# Patient Record
Sex: Female | Born: 1960 | Race: Black or African American | Hispanic: No | Smoking: Current every day smoker
Health system: Southern US, Community
[De-identification: ages and names within clinical notes are randomized; demographics above are authoritative.]

## PROBLEM LIST (undated history)

## (undated) ENCOUNTER — Emergency Department: Payer: 59 | Source: Home / Self Care

## (undated) DIAGNOSIS — J45909 Unspecified asthma, uncomplicated: Secondary | ICD-10-CM

## (undated) DIAGNOSIS — F419 Anxiety disorder, unspecified: Secondary | ICD-10-CM

## (undated) DIAGNOSIS — E119 Type 2 diabetes mellitus without complications: Secondary | ICD-10-CM

## (undated) DIAGNOSIS — J449 Chronic obstructive pulmonary disease, unspecified: Secondary | ICD-10-CM

## (undated) DIAGNOSIS — I639 Cerebral infarction, unspecified: Secondary | ICD-10-CM

## (undated) HISTORY — PX: KNEE SURGERY: SHX244

## (undated) HISTORY — PX: CARPAL TUNNEL RELEASE: SHX101

---

## 2007-01-22 ENCOUNTER — Emergency Department: Payer: Self-pay | Admitting: Emergency Medicine

## 2007-10-24 DIAGNOSIS — I6350 Cerebral infarction due to unspecified occlusion or stenosis of unspecified cerebral artery: Secondary | ICD-10-CM | POA: Insufficient documentation

## 2008-01-09 DIAGNOSIS — E7800 Pure hypercholesterolemia, unspecified: Secondary | ICD-10-CM | POA: Insufficient documentation

## 2008-10-30 ENCOUNTER — Emergency Department: Payer: Self-pay | Admitting: Emergency Medicine

## 2009-02-18 DIAGNOSIS — F17200 Nicotine dependence, unspecified, uncomplicated: Secondary | ICD-10-CM | POA: Insufficient documentation

## 2009-08-15 ENCOUNTER — Emergency Department: Payer: Self-pay | Admitting: Emergency Medicine

## 2009-08-25 ENCOUNTER — Emergency Department: Payer: Self-pay | Admitting: Emergency Medicine

## 2009-08-25 ENCOUNTER — Emergency Department: Payer: Self-pay | Admitting: Internal Medicine

## 2009-11-05 ENCOUNTER — Emergency Department: Payer: Self-pay | Admitting: Emergency Medicine

## 2009-12-02 DIAGNOSIS — F200 Paranoid schizophrenia: Secondary | ICD-10-CM | POA: Insufficient documentation

## 2010-11-14 ENCOUNTER — Emergency Department (HOSPITAL_COMMUNITY): Payer: Medicare Other

## 2010-11-14 ENCOUNTER — Emergency Department (HOSPITAL_COMMUNITY)
Admission: EM | Admit: 2010-11-14 | Discharge: 2010-11-14 | Disposition: A | Discharge status: Home / Self Care | Payer: Medicare Other | Attending: Emergency Medicine | Admitting: Emergency Medicine

## 2010-11-14 DIAGNOSIS — I10 Essential (primary) hypertension: Secondary | ICD-10-CM | POA: Insufficient documentation

## 2010-11-14 DIAGNOSIS — J45909 Unspecified asthma, uncomplicated: Secondary | ICD-10-CM | POA: Insufficient documentation

## 2010-11-14 DIAGNOSIS — M79609 Pain in unspecified limb: Secondary | ICD-10-CM | POA: Insufficient documentation

## 2010-11-14 DIAGNOSIS — R209 Unspecified disturbances of skin sensation: Secondary | ICD-10-CM | POA: Insufficient documentation

## 2010-11-14 DIAGNOSIS — Z8673 Personal history of transient ischemic attack (TIA), and cerebral infarction without residual deficits: Secondary | ICD-10-CM | POA: Insufficient documentation

## 2010-11-14 DIAGNOSIS — E789 Disorder of lipoprotein metabolism, unspecified: Secondary | ICD-10-CM | POA: Insufficient documentation

## 2010-11-14 DIAGNOSIS — R29898 Other symptoms and signs involving the musculoskeletal system: Secondary | ICD-10-CM | POA: Insufficient documentation

## 2010-11-14 LAB — POCT I-STAT, CHEM 8
Calcium, Ion: 1.12 mmol/L (ref 1.12–1.32)
Chloride: 110 mEq/L (ref 96–112)
Glucose, Bld: 97 mg/dL (ref 70–99)
HCT: 40 % (ref 36.0–46.0)
TCO2: 23 mmol/L (ref 0–100)

## 2011-07-14 DIAGNOSIS — E669 Obesity, unspecified: Secondary | ICD-10-CM | POA: Insufficient documentation

## 2012-04-01 ENCOUNTER — Emergency Department: Payer: Self-pay | Admitting: Emergency Medicine

## 2013-02-02 ENCOUNTER — Emergency Department (HOSPITAL_COMMUNITY)
Admission: EM | Admit: 2013-02-02 | Discharge: 2013-02-02 | Disposition: A | Discharge status: Home / Self Care | Payer: Medicare Other | Attending: Emergency Medicine | Admitting: Emergency Medicine

## 2013-02-02 ENCOUNTER — Encounter (HOSPITAL_COMMUNITY): Payer: Self-pay | Admitting: Family Medicine

## 2013-02-02 DIAGNOSIS — M549 Dorsalgia, unspecified: Secondary | ICD-10-CM | POA: Insufficient documentation

## 2013-02-02 DIAGNOSIS — Z7982 Long term (current) use of aspirin: Secondary | ICD-10-CM | POA: Insufficient documentation

## 2013-02-02 DIAGNOSIS — M79604 Pain in right leg: Secondary | ICD-10-CM

## 2013-02-02 DIAGNOSIS — F17200 Nicotine dependence, unspecified, uncomplicated: Secondary | ICD-10-CM | POA: Insufficient documentation

## 2013-02-02 DIAGNOSIS — R209 Unspecified disturbances of skin sensation: Secondary | ICD-10-CM | POA: Insufficient documentation

## 2013-02-02 DIAGNOSIS — Z8673 Personal history of transient ischemic attack (TIA), and cerebral infarction without residual deficits: Secondary | ICD-10-CM | POA: Insufficient documentation

## 2013-02-02 DIAGNOSIS — Z79899 Other long term (current) drug therapy: Secondary | ICD-10-CM | POA: Insufficient documentation

## 2013-02-02 DIAGNOSIS — F411 Generalized anxiety disorder: Secondary | ICD-10-CM | POA: Insufficient documentation

## 2013-02-02 DIAGNOSIS — IMO0001 Reserved for inherently not codable concepts without codable children: Secondary | ICD-10-CM | POA: Insufficient documentation

## 2013-02-02 DIAGNOSIS — M79609 Pain in unspecified limb: Secondary | ICD-10-CM

## 2013-02-02 HISTORY — DX: Cerebral infarction, unspecified: I63.9

## 2013-02-02 HISTORY — DX: Anxiety disorder, unspecified: F41.9

## 2013-02-02 MED ORDER — METHOCARBAMOL 500 MG PO TABS
500.0000 mg | ORAL_TABLET | Freq: Once | ORAL | Status: AC
  Administered 2013-02-02: 500 mg via ORAL
  Filled 2013-02-02: qty 1

## 2013-02-02 MED ORDER — HYDROCODONE-ACETAMINOPHEN 5-325 MG PO TABS: 1.0000 | ORAL_TABLET | ORAL | Status: DC | PRN

## 2013-02-02 MED ORDER — METHOCARBAMOL 500 MG PO TABS: 500.0000 mg | ORAL_TABLET | Freq: Two times a day (BID) | ORAL | Status: DC

## 2013-02-02 MED ORDER — HYDROCODONE-ACETAMINOPHEN 5-325 MG PO TABS
1.0000 | ORAL_TABLET | Freq: Once | ORAL | Status: AC
  Administered 2013-02-02: 1 via ORAL
  Filled 2013-02-02: qty 1

## 2013-02-02 NOTE — ED Notes (Signed)
Per pt sts cramps in right leg for a few days. sts from her calf up to her hip.

## 2013-02-02 NOTE — ED Notes (Signed)
Pt reports bending her right leg in an unusual position on Tuesday and immediately feeling cramps and shocks running from knee to toes and up to hip.  No obvious deformity.  Pt alert oriented X4

## 2013-02-02 NOTE — Progress Notes (Signed)
*  Preliminary Results* Right lower extremity venous duplex completed. Right lower extremity is negative for deep vein thrombosis. There is no evidence of right Baker's cyst. Preliminary results discussed with Dorene Grebe, RN.  02/02/2013 4:31 PM Gertie Fey, RVT, RDCS, RDMS

## 2013-02-02 NOTE — ED Provider Notes (Signed)
History    This chart was scribed for non-physician practitioner, Fayrene Helper PA-C, working with Gwyneth Sprout, MD by Donne Anon, ED Scribe. This patient was seen in room TR10C/TR10C and the patient's care was started at 1507.   CSN: [id number] Arrival date & time 02/02/13  1349  First MD Initiated Contact with Patient 02/02/13 1507     Chief Complaint  Patient presents with  . Leg Pain    The history is provided by the patient. No language interpreter was used.   HPI Comments: Kathleen Gilmore is a 52 y.o. female who presents to the Emergency Department complaining of 3 days of sudden onset, gradually worsening, constant throbbing right calf pain that radiates down to her toes. The pain began as she was laying down in bed, and no movement was associated with its onset. She reports associated tingling in her toes and lower back pain. The pain is worse with movement, and nothing makes it better. She has tried icy hot with little relief. She denies any previous similar episodes. She reports a hx of CVA affecting her right side with no residual deficit, but states she is otherwise healthy and denies recent surgery, hormone supplements, recent long trips, or hx of cancer. Denies urinary/bowel incontinence, or saddle paresthesia    Dr. Gwenlyn Fudge is her PCP. She has not seen him about this issue. She currently smokes 1 pack of cigarettes per day.  Past Medical History  Diagnosis Date  . Stroke   . Anxiety    History reviewed. No pertinent past surgical history. History reviewed. No pertinent family history. History  Substance Use Topics  . Smoking status: Current Every Day Smoker  . Smokeless tobacco: Not on file  . Alcohol Use: No   OB History   Grav Para Term Preterm Abortions TAB SAB Ect Mult Living                 Review of Systems  Constitutional: Negative for fever.  Genitourinary: Negative for dysuria.  Musculoskeletal: Positive for myalgias and back pain.   Neurological: Positive for numbness.  All other systems reviewed and are negative.    Allergies  Review of patient's allergies indicates no known allergies.  Home Medications   Current Outpatient Rx  Name  Route  Sig  Dispense  Refill  . albuterol (PROVENTIL HFA;VENTOLIN HFA) 108 (90 BASE) MCG/ACT inhaler   Inhalation   Inhale 2 puffs into the lungs 2 (two) times daily as needed for wheezing.         Marland Kitchen aspirin EC 81 MG tablet   Oral   Take 81 mg by mouth daily.         . budesonide-formoterol (SYMBICORT) 160-4.5 MCG/ACT inhaler   Inhalation   Inhale 2 puffs into the lungs 2 (two) times daily.         . clonazePAM (KLONOPIN) 0.5 MG tablet   Oral   Take 0.5 mg by mouth 2 (two) times daily.         . cyclobenzaprine (FLEXERIL) 5 MG tablet   Oral   Take 5 mg by mouth 2 (two) times daily as needed for muscle spasms.         . fluticasone (FLONASE) 50 MCG/ACT nasal spray   Nasal   Place 2 sprays into the nose as needed for rhinitis or allergies.         . simvastatin (ZOCOR) 40 MG tablet   Oral   Take 20 mg by mouth daily.         Marland Kitchen  traZODone (DESYREL) 100 MG tablet   Oral   Take 100 mg by mouth 2 (two) times daily.          BP 125/59  Pulse 90  Temp(Src) 98.3 F (36.8 C)  Resp 18  SpO2 97%  Physical Exam  Nursing note and vitals reviewed. Constitutional: She appears well-developed and well-nourished. No distress.  HENT:  Head: Normocephalic and atraumatic.  Eyes: Conjunctivae are normal.  Neck: Neck supple. No tracheal deviation present.  Cardiovascular: Normal rate and intact distal pulses.   Pulmonary/Chest: Effort normal. No respiratory distress.  Musculoskeletal: Normal range of motion. She exhibits tenderness.  Right leg with diffuse tenderness to right calf. No palpable cords, erythema, edema, bruising or deformity noted. Subjective peristhesia to sole of right foot and aslo to lateral aspect of right foot. Normal ROM through hip, knee  and ankle. Intact dorsalis pedis.   No significant midline spine tenderness, crepitus, or step off  Neurological: She is alert. She has normal reflexes.  Sensation intact to lower extremities bilaterally, no foot drop, however pt report mild paresthesia along sole of R foot and lateral aspect of R foot to ankle.    Skin: Skin is warm and dry. No rash noted.  Psychiatric: She has a normal mood and affect. Her behavior is normal.    ED Course  Procedures (including critical care time)  Author: Gwendolyn Fill Service: Vascular Lab Author Type: Cardiovascular Sonographer   Filed: 02/02/2013 4:33 PM Note Time: 02/02/2013 4:31 PM         *Preliminary Results*  Right lower extremity venous duplex completed.  Right lower extremity is negative for deep vein thrombosis. There is no evidence of right Baker's cyst.  Preliminary results discussed with Dorene Grebe, RN.  02/02/2013 4:31 PM  Gertie Fey, RVT, RDCS, RDMS      DIAGNOSTIC STUDIES: Oxygen Saturation is 97% on RA, adequate by my interpretation.    COORDINATION OF CARE: 3:21 PM Discussed treatment plan which includes doppler test, Norco and Robaxin with pt at bedside and pt agreed to plan.   Doubt spinal cord compression, spinal abscess, no significant red flags.  Since pt has prior stroke and having calf pain, doppler study ordered.  Doubt claudication as DP and PT pulses palpable.   5:37 PM Doppler study neg for DVT.  Since pt has no urinary/bowel incontinence, saddle paresthesia for signs of infection, will treat for MSK with RICE, pt to f/u with ortho.  Return precaution discussed.   Labs Reviewed - No data to display No results found. 1. Leg pain, right     MDM  BP 125/59  Pulse 90  Temp(Src) 98.3 F (36.8 C)  Resp 18  SpO2 97%  I have reviewed nursing notes and vital signs. I personally reviewed the imaging tests through PACS system  I reviewed available ER/hospitalization records thought the EMR   I  personally performed the services described in this documentation, which was scribed in my presence. The recorded information has been reviewed and is accurate.     Fayrene Helper, PA-C 02/02/13 1749  Fayrene Helper, PA-C 02/02/13 1752

## 2013-02-03 NOTE — ED Provider Notes (Signed)
Medical screening examination/treatment/procedure(s) were performed by non-physician practitioner and as supervising physician I was immediately available for consultation/collaboration.   Marguarite Markov, MD 02/03/13 2315 

## 2015-08-20 DIAGNOSIS — Z9889 Other specified postprocedural states: Secondary | ICD-10-CM | POA: Insufficient documentation

## 2015-11-15 ENCOUNTER — Emergency Department
Admission: EM | Admit: 2015-11-15 | Discharge: 2015-11-15 | Disposition: A | Discharge status: Home / Self Care | Payer: No Typology Code available for payment source | Attending: Emergency Medicine | Admitting: Emergency Medicine

## 2015-11-15 ENCOUNTER — Emergency Department: Payer: No Typology Code available for payment source

## 2015-11-15 DIAGNOSIS — S39012A Strain of muscle, fascia and tendon of lower back, initial encounter: Secondary | ICD-10-CM | POA: Diagnosis not present

## 2015-11-15 DIAGNOSIS — Y999 Unspecified external cause status: Secondary | ICD-10-CM | POA: Insufficient documentation

## 2015-11-15 DIAGNOSIS — Z7951 Long term (current) use of inhaled steroids: Secondary | ICD-10-CM | POA: Diagnosis not present

## 2015-11-15 DIAGNOSIS — F17200 Nicotine dependence, unspecified, uncomplicated: Secondary | ICD-10-CM | POA: Insufficient documentation

## 2015-11-15 DIAGNOSIS — Z7982 Long term (current) use of aspirin: Secondary | ICD-10-CM | POA: Diagnosis not present

## 2015-11-15 DIAGNOSIS — Y92410 Unspecified street and highway as the place of occurrence of the external cause: Secondary | ICD-10-CM | POA: Insufficient documentation

## 2015-11-15 DIAGNOSIS — Y939 Activity, unspecified: Secondary | ICD-10-CM | POA: Insufficient documentation

## 2015-11-15 DIAGNOSIS — M549 Dorsalgia, unspecified: Secondary | ICD-10-CM | POA: Diagnosis present

## 2015-11-15 DIAGNOSIS — Z79899 Other long term (current) drug therapy: Secondary | ICD-10-CM | POA: Diagnosis not present

## 2015-11-15 DIAGNOSIS — S161XXA Strain of muscle, fascia and tendon at neck level, initial encounter: Secondary | ICD-10-CM | POA: Diagnosis not present

## 2015-11-15 DIAGNOSIS — Z8673 Personal history of transient ischemic attack (TIA), and cerebral infarction without residual deficits: Secondary | ICD-10-CM | POA: Insufficient documentation

## 2015-11-15 MED ORDER — IBUPROFEN 800 MG PO TABS: 800.0000 mg | ORAL_TABLET | Freq: Three times a day (TID) | ORAL | Status: DC | PRN

## 2015-11-15 MED ORDER — METHOCARBAMOL 750 MG PO TABS: 750.0000 mg | ORAL_TABLET | Freq: Four times a day (QID) | ORAL | Status: DC

## 2015-11-15 MED ORDER — OXYCODONE-ACETAMINOPHEN 5-325 MG PO TABS: 1.0000 | ORAL_TABLET | Freq: Four times a day (QID) | ORAL | Status: DC | PRN

## 2015-11-15 MED ORDER — METHOCARBAMOL 500 MG PO TABS
1000.0000 mg | ORAL_TABLET | Freq: Once | ORAL | Status: AC
  Administered 2015-11-15: 1000 mg via ORAL
  Filled 2015-11-15: qty 2

## 2015-11-15 MED ORDER — OXYCODONE-ACETAMINOPHEN 5-325 MG PO TABS
1.0000 | ORAL_TABLET | Freq: Once | ORAL | Status: AC
  Administered 2015-11-15: 1 via ORAL
  Filled 2015-11-15: qty 1
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
## 2015-11-15 NOTE — Discharge Instructions (Signed)

## 2015-12-17 DIAGNOSIS — S134XXA Sprain of ligaments of cervical spine, initial encounter: Secondary | ICD-10-CM | POA: Insufficient documentation

## 2016-05-06 DIAGNOSIS — M545 Low back pain, unspecified: Secondary | ICD-10-CM | POA: Insufficient documentation

## 2016-10-24 ENCOUNTER — Emergency Department: Payer: No Typology Code available for payment source

## 2016-10-24 ENCOUNTER — Encounter: Payer: Self-pay | Admitting: Emergency Medicine

## 2016-10-24 ENCOUNTER — Emergency Department
Admission: EM | Admit: 2016-10-24 | Discharge: 2016-10-24 | Disposition: A | Discharge status: Home / Self Care | Payer: No Typology Code available for payment source | Attending: Emergency Medicine | Admitting: Emergency Medicine

## 2016-10-24 DIAGNOSIS — F17200 Nicotine dependence, unspecified, uncomplicated: Secondary | ICD-10-CM | POA: Insufficient documentation

## 2016-10-24 DIAGNOSIS — S161XXA Strain of muscle, fascia and tendon at neck level, initial encounter: Secondary | ICD-10-CM | POA: Insufficient documentation

## 2016-10-24 DIAGNOSIS — S39012A Strain of muscle, fascia and tendon of lower back, initial encounter: Secondary | ICD-10-CM | POA: Insufficient documentation

## 2016-10-24 DIAGNOSIS — Y999 Unspecified external cause status: Secondary | ICD-10-CM | POA: Diagnosis not present

## 2016-10-24 DIAGNOSIS — S29019A Strain of muscle and tendon of unspecified wall of thorax, initial encounter: Secondary | ICD-10-CM | POA: Diagnosis not present

## 2016-10-24 DIAGNOSIS — Y9389 Activity, other specified: Secondary | ICD-10-CM | POA: Insufficient documentation

## 2016-10-24 DIAGNOSIS — Y92410 Unspecified street and highway as the place of occurrence of the external cause: Secondary | ICD-10-CM | POA: Insufficient documentation

## 2016-10-24 DIAGNOSIS — Z7982 Long term (current) use of aspirin: Secondary | ICD-10-CM | POA: Insufficient documentation

## 2016-10-24 DIAGNOSIS — E119 Type 2 diabetes mellitus without complications: Secondary | ICD-10-CM | POA: Diagnosis not present

## 2016-10-24 DIAGNOSIS — S0990XA Unspecified injury of head, initial encounter: Secondary | ICD-10-CM | POA: Diagnosis present

## 2016-10-24 DIAGNOSIS — Z7984 Long term (current) use of oral hypoglycemic drugs: Secondary | ICD-10-CM | POA: Diagnosis not present

## 2016-10-24 DIAGNOSIS — M5412 Radiculopathy, cervical region: Secondary | ICD-10-CM

## 2016-10-24 HISTORY — DX: Type 2 diabetes mellitus without complications: E11.9

## 2016-10-24 MED ORDER — PREDNISONE 10 MG PO TABS: 10.0000 mg | ORAL_TABLET | Freq: Every day | ORAL | 0 refills | Status: DC

## 2016-10-24 MED ORDER — OXYCODONE HCL 5 MG PO TABS
5.0000 mg | ORAL_TABLET | Freq: Three times a day (TID) | ORAL | 0 refills | Status: AC | PRN
Start: 2016-10-24 — End: 2017-10-24

## 2016-10-24 MED ORDER — HYDROCODONE-ACETAMINOPHEN 5-325 MG PO TABS
1.0000 | ORAL_TABLET | ORAL | Status: AC
  Administered 2016-10-24: 1 via ORAL
  Filled 2016-10-24: qty 1

## 2016-10-24 MED ORDER — ORPHENADRINE CITRATE 30 MG/ML IJ SOLN
60.0000 mg | Freq: Two times a day (BID) | INTRAMUSCULAR | Status: DC
  Administered 2016-10-24: 60 mg via INTRAMUSCULAR
  Filled 2016-10-24: qty 2

## 2016-10-24 MED ORDER — CYCLOBENZAPRINE HCL 5 MG PO TABS: 5.0000 mg | ORAL_TABLET | Freq: Three times a day (TID) | ORAL | 0 refills | Status: DC | PRN

## 2016-10-24 MED ORDER — KETOROLAC TROMETHAMINE 30 MG/ML IJ SOLN
30.0000 mg | Freq: Once | INTRAMUSCULAR | Status: AC
  Administered 2016-10-24: 30 mg via INTRAMUSCULAR
  Filled 2016-10-24: qty 1

## 2016-10-24 NOTE — Discharge Instructions (Signed)
Please take medications as prescribed and return to the emergency department for any worsening symptoms urgent changes in her health. Follow-up with orthopedics if no improvement in 5-7 days.
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
## 2017-08-13 DIAGNOSIS — M179 Osteoarthritis of knee, unspecified: Secondary | ICD-10-CM | POA: Insufficient documentation

## 2017-09-04 ENCOUNTER — Emergency Department
Admission: EM | Admit: 2017-09-04 | Discharge: 2017-09-04 | Disposition: A | Discharge status: Home / Self Care | Payer: Medicare Other | Attending: Student in an Organized Health Care Education/Training Program | Admitting: Student in an Organized Health Care Education/Training Program

## 2017-09-04 ENCOUNTER — Other Ambulatory Visit: Payer: Self-pay

## 2017-09-04 ENCOUNTER — Encounter: Payer: Self-pay | Admitting: Emergency Medicine

## 2017-09-04 ENCOUNTER — Emergency Department: Payer: Medicare Other

## 2017-09-04 DIAGNOSIS — R079 Chest pain, unspecified: Secondary | ICD-10-CM | POA: Diagnosis present

## 2017-09-04 DIAGNOSIS — E119 Type 2 diabetes mellitus without complications: Secondary | ICD-10-CM | POA: Diagnosis not present

## 2017-09-04 DIAGNOSIS — Z7982 Long term (current) use of aspirin: Secondary | ICD-10-CM | POA: Insufficient documentation

## 2017-09-04 DIAGNOSIS — R0789 Other chest pain: Secondary | ICD-10-CM | POA: Insufficient documentation

## 2017-09-04 DIAGNOSIS — Z7984 Long term (current) use of oral hypoglycemic drugs: Secondary | ICD-10-CM | POA: Insufficient documentation

## 2017-09-04 DIAGNOSIS — F17200 Nicotine dependence, unspecified, uncomplicated: Secondary | ICD-10-CM | POA: Insufficient documentation

## 2017-09-04 DIAGNOSIS — Z79899 Other long term (current) drug therapy: Secondary | ICD-10-CM | POA: Insufficient documentation

## 2017-09-04 LAB — BASIC METABOLIC PANEL
ANION GAP: 8 (ref 5–15)
BUN: 11 mg/dL (ref 6–20)
CALCIUM: 9.5 mg/dL (ref 8.9–10.3)
CO2: 23 mmol/L (ref 22–32)
CREATININE: 0.86 mg/dL (ref 0.44–1.00)
Chloride: 108 mmol/L (ref 101–111)
GFR calc Af Amer: >60 mL/min (ref >60)
GFR calc non Af Amer: >60 mL/min (ref >60)
GLUCOSE: 120 mg/dL — AB (ref 65–99)
Potassium: 3.8 mmol/L (ref 3.5–5.1)
Sodium: 139 mmol/L (ref 135–145)

## 2017-09-04 LAB — CBC
HCT: 42.3 % (ref 35.0–47.0)
Hemoglobin: 14.4 g/dL (ref 12.0–16.0)
MCH: 29.3 pg (ref 26.0–34.0)
MCHC: 33.9 g/dL (ref 32.0–36.0)
MCV: 86.3 fL (ref 80.0–100.0)
Platelets: 222 10*3/uL (ref 150–440)
RBC: 4.91 MIL/uL (ref 3.80–5.20)
RDW: 13.5 % (ref 11.5–14.5)
WBC: 10.4 10*3/uL (ref 3.6–11.0)

## 2017-09-04 LAB — HEPATIC FUNCTION PANEL
ALT: 21 U/L (ref 14–54)
AST: 22 U/L (ref 15–41)
Albumin: 3.9 g/dL (ref 3.5–5.0)
Alkaline Phosphatase: 62 U/L (ref 38–126)
BILIRUBIN TOTAL: 0.4 mg/dL (ref 0.3–1.2)
Total Protein: 7.5 g/dL (ref 6.5–8.1)

## 2017-09-04 LAB — TROPONIN I

## 2017-09-04 MED ORDER — TRAZODONE HCL 100 MG PO TABS: 100.0000 mg | ORAL_TABLET | Freq: Every day | ORAL | Status: DC

## 2017-09-04 MED ORDER — TRAZODONE HCL 100 MG PO TABS: 100.0000 mg | ORAL_TABLET | Freq: Two times a day (BID) | ORAL | 0 refills | Status: DC

## 2017-09-04 MED ORDER — TRAZODONE HCL 100 MG PO TABS
ORAL_TABLET | ORAL | Status: AC
  Administered 2017-09-04: 100 mg
  Filled 2017-09-04: qty 1

## 2017-09-04 NOTE — Discharge Instructions (Signed)
Return for worsening pain, fevers, shortness of breath or for any additional questions.

## 2017-09-04 NOTE — ED Triage Notes (Signed)
Pt comes into the ED via POV c/o chest pain on the right side that radiates into her back.  Patient has deficits on the left side form a previous stroke.  Patient states she has shortness of breath with the chest pain.  Patient explains she has been coughing as well.  Patient has even and unlabored respirations at this time. Denies any N/V, dizziness, or fevers.

## 2017-09-04 NOTE — ED Provider Notes (Signed)
[facility] Emergency Department Provider Note    First MD Initiated Contact with Patient 09/04/17 1409     (approximate)  I have reviewed the triage vital signs and the nursing notes.   HISTORY  Chief Complaint Chest Pain    HPI Kathleen Gilmore is a 57 y.o. female presents to the ER with chief complaint of left-sided chest pain shoulder pain and also right-sided chest pain that started last night.  States is been intermittent and keeping her awake.  Started early last night.  No associated nausea or vomiting.  No fevers.  Is never had pain like this before.  States the pain is worse with movement.  States that she did feel she had to burp but could only do it when she was standing up but this did improve her symptoms.  No lower extremity swelling.  Past Medical History:  Diagnosis Date  . Anxiety   . Anxiety   . Diabetes mellitus without complication ([place])   . Stroke [facility])    No family history on file. Past Surgical History:  Procedure Laterality Date  . KNEE SURGERY     There are no active problems to display for this patient.     Prior to Admission medications   Medication Sig Start Date End Date Taking? Authorizing Provider  albuterol (PROVENTIL HFA;VENTOLIN HFA) 108 (90 BASE) MCG/ACT inhaler Inhale 2 puffs into the lungs 2 (two) times daily as needed for wheezing.    [provider]  aspirin EC 81 MG tablet Take 81 mg by mouth daily.    [provider]  budesonide-formoterol (SYMBICORT) 160-4.5 MCG/ACT inhaler Inhale 2 puffs into the lungs 2 (two) times daily.    [provider]  clonazePAM (KLONOPIN) 0.5 MG tablet Take 0.5 mg by mouth 2 (two) times daily.    [provider]  cyclobenzaprine (FLEXERIL) 5 MG tablet Take 1-2 tablets (5-10 mg total) by mouth 3 (three) times daily as needed for muscle spasms. 10/24/16   Duanne Guess, PA-C  metFORMIN (GLUCOPHAGE) 500 MG tablet Take 500 mg by mouth 2 (two) times  daily with a meal.    [provider]  oxyCODONE (ROXICODONE) 5 MG immediate release tablet Take 1 tablet (5 mg total) by mouth every 8 (eight) hours as needed. 10/24/16 10/24/17  Duanne Guess, PA-C  predniSONE (DELTASONE) 10 MG tablet Take 1 tablet (10 mg total) by mouth daily. 6,5,4,3,2,1 six day taper 10/24/16   Duanne Guess, PA-C  simvastatin (ZOCOR) 40 MG tablet Take 20 mg by mouth daily.    [provider]  traZODone (DESYREL) 100 MG tablet Take 1 tablet (100 mg total) by mouth 2 (two) times daily. 09/04/17   Merlyn Lot, MD    Allergies Patient has no known allergies.    Social History Social History   Tobacco Use  . Smoking status: Current Every Day Smoker  . Smokeless tobacco: Never Used  Substance Use Topics  . Alcohol use: No  . Drug use: No    Review of Systems Patient denies headaches, rhinorrhea, blurry vision, numbness, shortness of breath, chest pain, edema, cough, abdominal pain, nausea, vomiting, diarrhea, dysuria, fevers, rashes or hallucinations unless otherwise stated above in HPI. ____________________________________________   PHYSICAL EXAM:  VITAL SIGNS: Vitals:   09/04/17 1245  BP: 119/79  Pulse: 95  Resp: 18  Temp: 99.1 F (37.3 C)  SpO2: 96%    Constitutional: Alert and oriented. in no acute distress. Eyes: Conjunctivae are normal.  Head: Atraumatic. Nose: No congestion/rhinnorhea. Mouth/Throat: Mucous membranes are moist.   Neck: No stridor. Painless ROM.  Cardiovascular: Normal rate, regular rhythm. Grossly normal heart sounds.  Good peripheral circulation. Respiratory: Normal respiratory effort.  No retractions. Lungs CTAB. Gastrointestinal: Soft and nontender. No distention. No abdominal bruits. No CVA tenderness. Genitourinary:  Musculoskeletal:  ttp along left posterior chest wall and anterior chest wall. No lower extremity tenderness nor edema.  No joint effusions. Neurologic:  Normal speech and language.  No gross focal neurologic deficits are appreciated. No facial droop Skin:  Skin is warm, dry and intact. No rash noted. Psychiatric: Mood and affect are normal. Speech and behavior are normal.  ____________________________________________   LABS (all labs ordered are listed, but only abnormal results are displayed)  Results for orders placed or performed during the hospital encounter of 09/04/17 (from the past 24 hour(s))  Basic metabolic panel     Status: Abnormal   Collection Time: 09/04/17 12:47 PM  Result Value Ref Range   Sodium 139 135 - 145 mmol/L   Potassium 3.8 3.5 - 5.1 mmol/L   Chloride 108 101 - 111 mmol/L   CO2 23 22 - 32 mmol/L   Glucose, Bld 120 (H) 65 - 99 mg/dL   BUN 11 6 - 20 mg/dL   Creatinine, Ser 0.86 0.44 - 1.00 mg/dL   Calcium 9.5 8.9 - 10.3 mg/dL   GFR calc non Af Amer >60 >60 mL/min   GFR calc Af Amer >60 >60 mL/min   Anion gap 8 5 - 15  CBC     Status: None   Collection Time: 09/04/17 12:47 PM  Result Value Ref Range   WBC 10.4 3.6 - 11.0 K/uL   RBC 4.91 3.80 - 5.20 MIL/uL   Hemoglobin 14.4 12.0 - 16.0 g/dL   HCT 42.3 35.0 - 47.0 %   MCV 86.3 80.0 - 100.0 fL   MCH 29.3 26.0 - 34.0 pg   MCHC 33.9 32.0 - 36.0 g/dL   RDW 13.5 11.5 - 14.5 %   Platelets 222 150 - 440 K/uL  Troponin I     Status: None   Collection Time: 09/04/17 12:47 PM  Result Value Ref Range   Troponin I <0.03 <0.03 ng/mL  Hepatic function panel     Status: Abnormal   Collection Time: 09/04/17 12:47 PM  Result Value Ref Range   Total Protein 7.5 6.5 - 8.1 g/dL   Albumin 3.9 3.5 - 5.0 g/dL   AST 22 15 - 41 U/L   ALT 21 14 - 54 U/L   Alkaline Phosphatase 62 38 - 126 U/L   Total Bilirubin 0.4 0.3 - 1.2 mg/dL   Bilirubin, Direct <0.1 (L) 0.1 - 0.5 mg/dL   Indirect Bilirubin NOT CALCULATED 0.3 - 0.9 mg/dL   ____________________________________________  EKG My review and personal interpretation at Time: 12:54   Indication: 95  Rate: 95  Rhythm: sinus Axis: normal Other:  nonspecific st changes that are unchanged from previous EKG ____________________________________________  RADIOLOGY  I personally reviewed all radiographic images ordered to evaluate for the above acute complaints and reviewed radiology reports and findings.  These findings were personally discussed with the patient.  Please see medical record for radiology report.  ____________________________________________   PROCEDURES  Procedure(s) performed:  Procedures    Critical Care performed: no ____________________________________________   INITIAL IMPRESSION / ASSESSMENT AND PLAN / ED COURSE  Pertinent labs & imaging results that were available during my care of the patient were  reviewed by me and considered in my medical decision making (see chart for details).  DDX: ACS, pericarditis, esophagitis, boerhaaves, pe, dissection, pna, bronchitis, costochondritis   Kathleen Gilmore is a 57 y.o. who presents to the ED with symptoms as described above.  She is well-appearing and in no acute distress.  No evidence of EKG changes as compared to previous and symptoms would be less consistent with ACS particularly given negative troponin several hours after onset of pain.  Chest x-ray shows no mediastinal widening.  No evidence of CHF.  Is not clinically consistent with pulmonary embolism at this time.  Most consistent with musculoskeletal pain given the reproducibility.  Patient will be given pain medication and follow-up with PCP.  Have discussed with the patient and available family all diagnostics and treatments performed thus far and all questions were answered to the best of my ability. The patient demonstrates understanding and agreement with plan.       ____________________________________________   FINAL CLINICAL IMPRESSION(S) / ED DIAGNOSES  Final diagnoses:  Chest wall pain      NEW MEDICATIONS STARTED DURING THIS VISIT:  Current Discharge Medication List       Note:   This document was prepared using Dragon voice recognition software and may include unintentional dictation errors.    Merlyn Lot, MD 09/04/17 [phone]

## 2017-10-23 ENCOUNTER — Emergency Department: Payer: Medicare Other

## 2017-10-23 ENCOUNTER — Emergency Department
Admission: EM | Admit: 2017-10-23 | Discharge: 2017-10-23 | Disposition: A | Discharge status: Home / Self Care | Payer: Medicare Other | Attending: Emergency Medicine | Admitting: Emergency Medicine

## 2017-10-23 ENCOUNTER — Encounter: Payer: Self-pay | Admitting: Medical Oncology

## 2017-10-23 DIAGNOSIS — Z8673 Personal history of transient ischemic attack (TIA), and cerebral infarction without residual deficits: Secondary | ICD-10-CM | POA: Insufficient documentation

## 2017-10-23 DIAGNOSIS — E119 Type 2 diabetes mellitus without complications: Secondary | ICD-10-CM | POA: Insufficient documentation

## 2017-10-23 DIAGNOSIS — Z79899 Other long term (current) drug therapy: Secondary | ICD-10-CM | POA: Diagnosis not present

## 2017-10-23 DIAGNOSIS — Z7984 Long term (current) use of oral hypoglycemic drugs: Secondary | ICD-10-CM | POA: Insufficient documentation

## 2017-10-23 DIAGNOSIS — R300 Dysuria: Secondary | ICD-10-CM | POA: Insufficient documentation

## 2017-10-23 DIAGNOSIS — Z7982 Long term (current) use of aspirin: Secondary | ICD-10-CM | POA: Diagnosis not present

## 2017-10-23 DIAGNOSIS — N390 Urinary tract infection, site not specified: Secondary | ICD-10-CM | POA: Diagnosis not present

## 2017-10-23 DIAGNOSIS — F17200 Nicotine dependence, unspecified, uncomplicated: Secondary | ICD-10-CM | POA: Diagnosis not present

## 2017-10-23 DIAGNOSIS — M25569 Pain in unspecified knee: Secondary | ICD-10-CM

## 2017-10-23 DIAGNOSIS — M25561 Pain in right knee: Secondary | ICD-10-CM | POA: Diagnosis not present

## 2017-10-23 LAB — URINALYSIS, COMPLETE (UACMP) WITH MICROSCOPIC
Bilirubin Urine: NEGATIVE
Glucose, UA: NEGATIVE mg/dL
HGB URINE DIPSTICK: NEGATIVE
Ketones, ur: NEGATIVE mg/dL
Nitrite: NEGATIVE
PROTEIN: NEGATIVE mg/dL
Specific Gravity, Urine: 1.009 (ref 1.005–1.030)
pH: 5 (ref 5.0–8.0)

## 2017-10-23 MED ORDER — CEPHALEXIN 500 MG PO CAPS: 500.0000 mg | ORAL_CAPSULE | Freq: Three times a day (TID) | ORAL | 0 refills | Status: AC

## 2017-10-23 MED ORDER — CEPHALEXIN 500 MG PO CAPS
500.0000 mg | ORAL_CAPSULE | Freq: Once | ORAL | Status: AC
  Administered 2017-10-23: 500 mg via ORAL
  Filled 2017-10-23: qty 1

## 2017-10-23 NOTE — ED Triage Notes (Signed)
Pt reports she tripped and fell yesterday and injured her Rt knee. Pt is ambulatory.

## 2017-10-23 NOTE — ED Provider Notes (Signed)
[facility] Emergency Department Provider Note  ___________________________________________   First MD Initiated Contact with Patient 10/23/17 1413     (approximate)  I have reviewed the triage vital signs and the nursing notes.   HISTORY  Chief Complaint Knee Pain   HPI Kathleen Gilmore is a 57 y.o. female with a history of diabetes, stroke and anxiety was presented to the emergency department after a fall 1 day ago.  Says that she was going up the stairs to her porch when she fell, hitting her right knee, medially, against the deck.  She does not report hitting her head or losing consciousness.  Says that she is now had a tightness sensation to the medial right knee but is able to ambulate.  Says that she is also had a foul smell to her urine for the past 2 days and has felt a slight burning with urination.  States that she has had urinary tract infections in the past which felt similarly.  Does not report fever or back pain.  Past Medical History:  Diagnosis Date  . Anxiety   . Anxiety   . Diabetes mellitus without complication ([place])   . Stroke [facility])     There are no active problems to display for this patient.   Past Surgical History:  Procedure Laterality Date  . KNEE SURGERY      Prior to Admission medications   Medication Sig Start Date End Date Taking? Authorizing Provider  albuterol (PROVENTIL HFA;VENTOLIN HFA) 108 (90 BASE) MCG/ACT inhaler Inhale 2 puffs into the lungs 2 (two) times daily as needed for wheezing.    [provider]  aspirin EC 81 MG tablet Take 81 mg by mouth daily.    [provider]  budesonide-formoterol (SYMBICORT) 160-4.5 MCG/ACT inhaler Inhale 2 puffs into the lungs 2 (two) times daily.    [provider]  clonazePAM (KLONOPIN) 0.5 MG tablet Take 0.5 mg by mouth 2 (two) times daily.    [provider]  cyclobenzaprine (FLEXERIL) 5 MG tablet Take 1-2 tablets (5-10 mg total) by mouth 3  (three) times daily as needed for muscle spasms. 10/24/16   Duanne Guess, PA-C  metFORMIN (GLUCOPHAGE) 500 MG tablet Take 500 mg by mouth 2 (two) times daily with a meal.    [provider]  oxyCODONE (ROXICODONE) 5 MG immediate release tablet Take 1 tablet (5 mg total) by mouth every 8 (eight) hours as needed. 10/24/16 10/24/17  Duanne Guess, PA-C  predniSONE (DELTASONE) 10 MG tablet Take 1 tablet (10 mg total) by mouth daily. 6,5,4,3,2,1 six day taper 10/24/16   Duanne Guess, PA-C  simvastatin (ZOCOR) 40 MG tablet Take 20 mg by mouth daily.    [provider]  traZODone (DESYREL) 100 MG tablet Take 1 tablet (100 mg total) by mouth 2 (two) times daily. 09/04/17   Merlyn Lot, MD    Allergies Patient has no known allergies.  No family history on file.  Social History Social History   Tobacco Use  . Smoking status: Current Every Day Smoker  . Smokeless tobacco: Never Used  Substance Use Topics  . Alcohol use: No  . Drug use: No    Review of Systems  Constitutional: No fever/chills Eyes: No visual changes. ENT: No sore throat. Cardiovascular: Denies chest pain. Respiratory: Denies shortness of breath. Gastrointestinal: No abdominal pain.  No nausea, no vomiting.  No diarrhea.  No constipation. Genitourinary: As above Musculoskeletal: Negative for back pain. Skin: Negative  for rash. Neurological: Negative for headaches, focal weakness or numbness.   ____________________________________________   PHYSICAL EXAM:  VITAL SIGNS: ED Triage Vitals  Enc Vitals Group     BP 10/23/17 1319 (!) 120/55     Pulse Rate 10/23/17 1319 80     Resp 10/23/17 1319 14     Temp 10/23/17 1319 97.9 F (36.6 C)     Temp Source 10/23/17 1319 Oral     SpO2 10/23/17 1319 97 %     Weight 10/23/17 1310 200 lb (90.7 kg)     Height 10/23/17 1320 5\' 7"  (1.702 m)     Head Circumference --      Peak Flow --      Pain Score 10/23/17 1310 8     Pain Loc --      Pain  Edu? --      Excl. in [place]? --     Constitutional: Alert and oriented. Well appearing and in no acute distress. Eyes: Conjunctivae are normal.  Head: Atraumatic. Nose: No congestion/rhinnorhea. Mouth/Throat: Mucous membranes are moist.  Neck: No stridor.   Cardiovascular: Normal rate, regular rhythm. Grossly normal heart sounds.  Good peripheral circulation with intact right dorsalis pedis pulse. Respiratory: Normal respiratory effort.  No retractions. Lungs CTAB. Gastrointestinal: Soft and nontender. No distention. No CVA tenderness. Musculoskeletal: Right lower extremity with tenderness to palpation over the distal, medial condyle of the femur.  No deformity.  No swelling.  No ecchymosis.  No ligamentous laxity.   Neurologic:  Normal speech and language. No gross focal neurologic deficits are appreciated. Skin:  Skin is warm, dry and intact. No rash noted. Psychiatric: Mood and affect are normal. Speech and behavior are normal.  ____________________________________________   LABS (all labs ordered are listed, but only abnormal results are displayed)  Labs Reviewed  URINALYSIS, COMPLETE (UACMP) WITH MICROSCOPIC - Abnormal; Notable for the following components:      Result Value   Color, Urine YELLOW (*)    APPearance HAZY (*)    Leukocytes, UA MODERATE (*)    Bacteria, UA FEW (*)    Squamous Epithelial / LPF 0-5 (*)    All other components within normal limits   ____________________________________________  EKG   ____________________________________________  RADIOLOGY  Knee x-ray without pathology. ____________________________________________   PROCEDURES  Procedure(s) performed:   Procedures  Critical Care performed:   ____________________________________________   INITIAL IMPRESSION / ASSESSMENT AND PLAN / ED COURSE  Pertinent labs & imaging results that were available during my care of the patient were reviewed by me and considered in my medical decision  making (see chart for details).  DDX: UTI, dysuria, knee contusion, meniscus injury, ligamentous injury As part of my medical decision making, I reviewed the following data within the electronic MEDICAL RECORD NUMBER Notes from prior ED visits  ----------------------------------------- 3:27 PM on 10/23/2017 -----------------------------------------  I reviewed the imaging with the patient as well as the diagnosis of UTI.  She will continue with tramadol as well as icy hot and keep the limb elevated.  We will apply an Ace wrap.  We will also give antibiotics for the UTI.  The patient is understanding of this plan willing to comply. ____________________________________________   FINAL CLINICAL IMPRESSION(S) / ED DIAGNOSES  Knee contusion.  UTI.    NEW MEDICATIONS STARTED DURING THIS VISIT:  New Prescriptions   No medications on file     Note:  This document was prepared using Dragon voice recognition software and may include unintentional dictation  errors.     Orbie Pyo, MD 10/23/17 [phone]

## 2017-10-26 LAB — URINE CULTURE: Culture: >=100000 — AB

## 2019-01-31 ENCOUNTER — Emergency Department
Admission: EM | Admit: 2019-01-31 | Discharge: 2019-01-31 | Disposition: A | Discharge status: Home / Self Care | Payer: Medicare HMO | Attending: Emergency Medicine | Admitting: Emergency Medicine

## 2019-01-31 ENCOUNTER — Encounter: Payer: Self-pay | Admitting: *Deleted

## 2019-01-31 ENCOUNTER — Emergency Department: Payer: Medicare HMO

## 2019-01-31 ENCOUNTER — Other Ambulatory Visit: Payer: Self-pay

## 2019-01-31 DIAGNOSIS — M25562 Pain in left knee: Secondary | ICD-10-CM | POA: Insufficient documentation

## 2019-01-31 DIAGNOSIS — F17210 Nicotine dependence, cigarettes, uncomplicated: Secondary | ICD-10-CM | POA: Insufficient documentation

## 2019-01-31 DIAGNOSIS — E119 Type 2 diabetes mellitus without complications: Secondary | ICD-10-CM | POA: Diagnosis not present

## 2019-01-31 DIAGNOSIS — Z7984 Long term (current) use of oral hypoglycemic drugs: Secondary | ICD-10-CM | POA: Diagnosis not present

## 2019-01-31 DIAGNOSIS — Y999 Unspecified external cause status: Secondary | ICD-10-CM | POA: Insufficient documentation

## 2019-01-31 DIAGNOSIS — Z7982 Long term (current) use of aspirin: Secondary | ICD-10-CM | POA: Diagnosis not present

## 2019-01-31 DIAGNOSIS — M79632 Pain in left forearm: Secondary | ICD-10-CM | POA: Diagnosis not present

## 2019-01-31 DIAGNOSIS — Y92414 Local residential or business street as the place of occurrence of the external cause: Secondary | ICD-10-CM | POA: Insufficient documentation

## 2019-01-31 DIAGNOSIS — Y93I9 Activity, other involving external motion: Secondary | ICD-10-CM | POA: Diagnosis not present

## 2019-01-31 DIAGNOSIS — S59912A Unspecified injury of left forearm, initial encounter: Secondary | ICD-10-CM | POA: Diagnosis present

## 2019-01-31 MED ORDER — ACETAMINOPHEN 325 MG PO TABS
650.0000 mg | ORAL_TABLET | Freq: Once | ORAL | Status: AC
  Administered 2019-01-31: 22:00:00 650 mg via ORAL
  Filled 2019-01-31: qty 2

## 2019-01-31 MED ORDER — ACETAMINOPHEN 500 MG PO TABS: 500.0000 mg | ORAL_TABLET | Freq: Four times a day (QID) | ORAL | 0 refills | Status: DC | PRN

## 2019-01-31 MED ORDER — CYCLOBENZAPRINE HCL 5 MG PO TABS: ORAL_TABLET | ORAL | 0 refills | Status: DC

## 2019-01-31 MED ORDER — CYCLOBENZAPRINE HCL 10 MG PO TABS
5.0000 mg | ORAL_TABLET | Freq: Once | ORAL | Status: AC
  Administered 2019-01-31: 22:00:00 5 mg via ORAL
  Filled 2019-01-31: qty 1

## 2019-01-31 MED ORDER — LIDOCAINE 5 % EX PTCH: 1.0000 | MEDICATED_PATCH | CUTANEOUS | 0 refills | Status: DC
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
## 2019-03-27 ENCOUNTER — Other Ambulatory Visit: Payer: Self-pay

## 2019-03-27 ENCOUNTER — Emergency Department
Admission: EM | Admit: 2019-03-27 | Discharge: 2019-03-28 | Disposition: A | Discharge status: Home / Self Care | Payer: Medicare HMO | Attending: Emergency Medicine | Admitting: Emergency Medicine

## 2019-03-27 ENCOUNTER — Encounter: Payer: Self-pay | Admitting: Emergency Medicine

## 2019-03-27 DIAGNOSIS — Z720 Tobacco use: Secondary | ICD-10-CM | POA: Diagnosis not present

## 2019-03-27 DIAGNOSIS — Z8673 Personal history of transient ischemic attack (TIA), and cerebral infarction without residual deficits: Secondary | ICD-10-CM | POA: Diagnosis not present

## 2019-03-27 DIAGNOSIS — E119 Type 2 diabetes mellitus without complications: Secondary | ICD-10-CM | POA: Insufficient documentation

## 2019-03-27 DIAGNOSIS — Z7982 Long term (current) use of aspirin: Secondary | ICD-10-CM | POA: Insufficient documentation

## 2019-03-27 DIAGNOSIS — Z79899 Other long term (current) drug therapy: Secondary | ICD-10-CM | POA: Diagnosis not present

## 2019-03-27 DIAGNOSIS — N3000 Acute cystitis without hematuria: Secondary | ICD-10-CM | POA: Diagnosis not present

## 2019-03-27 DIAGNOSIS — R300 Dysuria: Secondary | ICD-10-CM | POA: Diagnosis present

## 2019-03-27 DIAGNOSIS — Z7984 Long term (current) use of oral hypoglycemic drugs: Secondary | ICD-10-CM | POA: Insufficient documentation

## 2019-03-27 LAB — CBC WITH DIFFERENTIAL/PLATELET
Abs Immature Granulocytes: 0.04 10*3/uL (ref 0.00–0.07)
Basophils Absolute: 0.1 10*3/uL (ref 0.0–0.1)
Basophils Relative: 1 %
Eosinophils Absolute: 0 10*3/uL (ref 0.0–0.5)
Eosinophils Relative: 0 %
HCT: 40.8 % (ref 36.0–46.0)
Hemoglobin: 13.8 g/dL (ref 12.0–15.0)
Immature Granulocytes: 0 %
Lymphocytes Relative: 23 %
Lymphs Abs: 3.1 10*3/uL (ref 0.7–4.0)
MCH: 28.5 pg (ref 26.0–34.0)
MCHC: 33.8 g/dL (ref 30.0–36.0)
MCV: 84.3 fL (ref 80.0–100.0)
Monocytes Absolute: 1.2 10*3/uL — ABNORMAL HIGH (ref 0.1–1.0)
Monocytes Relative: 9 %
Neutro Abs: 9.2 10*3/uL — ABNORMAL HIGH (ref 1.7–7.7)
Neutrophils Relative %: 67 %
Platelets: 227 10*3/uL (ref 150–400)
RBC: 4.84 MIL/uL (ref 3.87–5.11)
RDW: 13.1 % (ref 11.5–15.5)
WBC: 13.6 10*3/uL — ABNORMAL HIGH (ref 4.0–10.5)
nRBC: 0 % (ref 0.0–0.2)

## 2019-03-27 LAB — URINALYSIS, COMPLETE (UACMP) WITH MICROSCOPIC
Bilirubin Urine: NEGATIVE
Glucose, UA: NEGATIVE mg/dL
Ketones, ur: NEGATIVE mg/dL
Nitrite: NEGATIVE
Protein, ur: 30 mg/dL — AB
Specific Gravity, Urine: 1.004 — ABNORMAL LOW (ref 1.005–1.030)
Squamous Epithelial / LPF: NONE SEEN (ref 0–5)
WBC, UA: >50 WBC/hpf — ABNORMAL HIGH (ref 0–5)
pH: 6 (ref 5.0–8.0)

## 2019-03-27 LAB — COMPREHENSIVE METABOLIC PANEL
ALT: 31 U/L (ref 0–44)
AST: 29 U/L (ref 15–41)
Albumin: 4 g/dL (ref 3.5–5.0)
Alkaline Phosphatase: 76 U/L (ref 38–126)
Anion gap: 8 (ref 5–15)
BUN: 14 mg/dL (ref 6–20)
CO2: 23 mmol/L (ref 22–32)
Calcium: 9.3 mg/dL (ref 8.9–10.3)
Chloride: 106 mmol/L (ref 98–111)
Creatinine, Ser: 1.1 mg/dL — ABNORMAL HIGH (ref 0.44–1.00)
GFR calc Af Amer: >60 mL/min (ref >60)
GFR calc non Af Amer: 55 mL/min — ABNORMAL LOW (ref >60)
Glucose, Bld: 126 mg/dL — ABNORMAL HIGH (ref 70–99)
Potassium: 3.6 mmol/L (ref 3.5–5.1)
Sodium: 137 mmol/L (ref 135–145)
Total Bilirubin: 0.4 mg/dL (ref 0.3–1.2)
Total Protein: 7.8 g/dL (ref 6.5–8.1)

## 2019-03-27 MED ORDER — OXYCODONE-ACETAMINOPHEN 5-325 MG PO TABS
1.0000 | ORAL_TABLET | Freq: Once | ORAL | Status: AC
  Administered 2019-03-27: 1 via ORAL
  Filled 2019-03-27: qty 1

## 2019-03-27 NOTE — ED Provider Notes (Signed)
[facility] Emergency Department Provider Note  ____________________________________________   First MD Initiated Contact with Patient 03/27/19 2324     (approximate)  I have reviewed the triage vital signs and the nursing notes.   HISTORY  Chief Complaint Flank Pain    HPI Kathleen Gilmore is a 58 y.o. female  Here with dysuria, abd pain. Pt reports that her sx began yesterday as mild burning w/ urination and urgency. She peed every 1-2 hours which is very abnormal for her. Every time she pees, she gets a sharp, stabbing, severe pain in her suprapubic area. She awoke today due ot this pain. No flank pain. No fever, chills, nausea, vomiting. No h/or ecurrent UTIs. Pain is only w/ urination, denies any h/o kidney stones. No hematuria. No alleviating factors. Has not tried/taken anything.  No vaginal bleeding or discharge.       Past Medical History:  Diagnosis Date  . Anxiety   . Anxiety   . Diabetes mellitus without complication ([place])   . Stroke [facility])     There are no active problems to display for this patient.   Past Surgical History:  Procedure Laterality Date  . KNEE SURGERY      Prior to Admission medications   Medication Sig Start Date End Date Taking? Authorizing Provider  acetaminophen (TYLENOL) 500 MG tablet Take 1 tablet (500 mg total) by mouth every 6 (six) hours as needed. 01/31/19   Laban Emperor, PA-C  albuterol (PROVENTIL HFA;VENTOLIN HFA) 108 (90 BASE) MCG/ACT inhaler Inhale 2 puffs into the lungs 2 (two) times daily as needed for wheezing.    [provider]  aspirin EC 81 MG tablet Take 81 mg by mouth daily.    [provider]  budesonide-formoterol (SYMBICORT) 160-4.5 MCG/ACT inhaler Inhale 2 puffs into the lungs 2 (two) times daily.    [provider]  cephALEXin (KEFLEX) 500 MG capsule Take 1 capsule (500 mg total) by mouth 3 (three) times daily for 7 days. 03/28/19 04/04/19  Duffy Bruce, MD   clonazePAM (KLONOPIN) 0.5 MG tablet Take 0.5 mg by mouth 2 (two) times daily.    [provider]  cyclobenzaprine (FLEXERIL) 5 MG tablet Take 1-2 tablets 3 times daily as needed 01/31/19   Laban Emperor, PA-C  ibuprofen (ADVIL) 600 MG tablet Take 1 tablet (600 mg total) by mouth every 8 (eight) hours as needed for moderate pain. 03/28/19   Duffy Bruce, MD  lidocaine (LIDODERM) 5 % Place 1 patch onto the skin daily. Remove & Discard patch within 12 hours or as directed by MD 01/31/19   Laban Emperor, PA-C  metFORMIN (GLUCOPHAGE) 500 MG tablet Take 500 mg by mouth 2 (two) times daily with a meal.    [provider]  ondansetron (ZOFRAN ODT) 4 MG disintegrating tablet Take 1 tablet (4 mg total) by mouth every 8 (eight) hours as needed for nausea or vomiting. 03/28/19   Duffy Bruce, MD  phenazopyridine (PYRIDIUM) 200 MG tablet Take 1 tablet (200 mg total) by mouth 3 (three) times daily with meals for 2 days. 03/28/19 03/30/19  Duffy Bruce, MD  predniSONE (DELTASONE) 10 MG tablet Take 1 tablet (10 mg total) by mouth daily. 6,5,4,3,2,1 six day taper 10/24/16   Duanne Guess, PA-C  simvastatin (ZOCOR) 40 MG tablet Take 20 mg by mouth daily.    [provider]  traZODone (DESYREL) 100 MG tablet Take 1 tablet (100 mg total) by mouth 2 (two) times daily. 09/04/17  Merlyn Lot, MD    Allergies Patient has no known allergies.  No family history on file.  Social History Social History   Tobacco Use  . Smoking status: Current Every Day Smoker  . Smokeless tobacco: Never Used  Substance Use Topics  . Alcohol use: No  . Drug use: No    Review of Systems  Review of Systems  Constitutional: Positive for fatigue. Negative for fever.  HENT: Negative for congestion and sore throat.   Eyes: Negative for visual disturbance.  Respiratory: Negative for cough and shortness of breath.   Cardiovascular: Negative for chest pain.  Gastrointestinal: Positive for  abdominal pain. Negative for diarrhea, nausea and vomiting.  Genitourinary: Positive for dysuria and urgency. Negative for flank pain.  Musculoskeletal: Negative for back pain and neck pain.  Skin: Negative for rash and wound.  Neurological: Negative for weakness.     ____________________________________________  PHYSICAL EXAM:      VITAL SIGNS: ED Triage Vitals  Enc Vitals Group     BP 03/27/19 2006 122/72     Pulse Rate 03/27/19 2006 94     Resp 03/27/19 2006 16     Temp 03/27/19 2006 99.2 F (37.3 C)     Temp Source 03/27/19 2006 Oral     SpO2 03/27/19 2006 96 %     Weight 03/27/19 1957 206 lb (93.4 kg)     Height 03/27/19 1957 5\' 8"  (1.727 m)     Head Circumference --      Peak Flow --      Pain Score 03/27/19 1956 10     Pain Loc --      Pain Edu? --      Excl. in [place]? --      Physical Exam Vitals signs and nursing note reviewed.  Constitutional:      General: She is not in acute distress.    Appearance: She is well-developed.  HENT:     Head: Normocephalic and atraumatic.  Eyes:     Conjunctiva/sclera: Conjunctivae normal.  Neck:     Musculoskeletal: Neck supple.  Cardiovascular:     Rate and Rhythm: Normal rate and regular rhythm.     Heart sounds: Normal heart sounds. No murmur. No friction rub.  Pulmonary:     Effort: Pulmonary effort is normal. No respiratory distress.     Breath sounds: Normal breath sounds. No wheezing or rales.  Abdominal:     General: There is no distension.     Palpations: Abdomen is soft.     Tenderness: There is abdominal tenderness (mild, suprapubic).     Comments: No CVAT bilaterally  Skin:    General: Skin is warm.     Capillary Refill: Capillary refill takes less than 2 seconds.  Neurological:     Mental Status: She is alert and oriented to person, place, and time.     Motor: No abnormal muscle tone.       ____________________________________________   LABS (all labs ordered are listed, but only abnormal results  are displayed)  Labs Reviewed  CBC WITH DIFFERENTIAL/PLATELET - Abnormal; Notable for the following components:      Result Value   WBC 13.6 (*)    Neutro Abs 9.2 (*)    Monocytes Absolute 1.2 (*)    All other components within normal limits  COMPREHENSIVE METABOLIC PANEL - Abnormal; Notable for the following components:   Glucose, Bld 126 (*)    Creatinine, Ser 1.10 (*)    GFR calc  non Af Amer 55 (*)    All other components within normal limits  URINALYSIS, COMPLETE (UACMP) WITH MICROSCOPIC - Abnormal; Notable for the following components:   Color, Urine STRAW (*)    APPearance CLEAR (*)    Specific Gravity, Urine 1.004 (*)    Hgb urine dipstick LARGE (*)    Protein, ur 30 (*)    Leukocytes,Ua MODERATE (*)    WBC, UA >50 (*)    Bacteria, UA RARE (*)    All other components within normal limits  URINE CULTURE    ____________________________________________  EKG: None ________________________________________  RADIOLOGY All imaging, including plain films, CT scans, and ultrasounds, independently reviewed by me, and interpretations confirmed via formal radiology reads.  ED MD interpretation:   None  Official radiology report(s): No results found.  ____________________________________________  PROCEDURES   Procedure(s) performed (including Critical Care):  Procedures  ____________________________________________  INITIAL IMPRESSION / MDM / [place] / ED COURSE  As part of my medical decision making, I reviewed the following data within the electronic MEDICAL RECORD NUMBER Notes from prior ED visits and [place] Controlled Substance Database      *Sarrinah Gardin was evaluated in Emergency Department on 03/28/2019 for the symptoms described in the history of present illness. She was evaluated in the context of the global COVID-19 pandemic, which necessitated consideration that the patient might be at risk for infection with the SARS-CoV-2 virus that causes COVID-19.  Institutional protocols and algorithms that pertain to the evaluation of patients at risk for COVID-19 are in a state of rapid change based on information released by regulatory bodies including the CDC and federal and state organizations. These policies and algorithms were followed during the patient's care in the ED.  Some ED evaluations and interventions may be delayed as a result of limited staffing during the pandemic.*   Clinical Course as of Mar 28 11  Wed Mar 28, 2019  0011 58 yo F    [CI]  25 58 yo F here with dysuria, frequency. Suspect uncomplicated UTI. No flank pain, fever, n/v, or signs of pyelo clinically. Mild leukocytosis but labs o/w reassuring. She is tolerating PO. No flank pain, hematuria, and pain is w/ urination only - do not suspect stone or infected stone. No vaginal bleeding, discharge, or s/s PID. Will d/c with Keflex, longer course given mild back pain, and supportive care. First dose given here. Return precautions given.   [CI]    Clinical Course User Index [CI] Duffy Bruce, MD    Medical Decision Making: As above.  ____________________________________________  FINAL CLINICAL IMPRESSION(S) / ED DIAGNOSES  Final diagnoses:  Acute cystitis without hematuria     MEDICATIONS GIVEN DURING THIS VISIT:  Medications  cephALEXin (KEFLEX) capsule 500 mg (has no administration in time range)  ibuprofen (ADVIL) tablet 600 mg (has no administration in time range)  ondansetron (ZOFRAN-ODT) disintegrating tablet 4 mg (has no administration in time range)  oxyCODONE-acetaminophen (PERCOCET/ROXICET) 5-325 MG per tablet 1 tablet (1 tablet Oral Given 03/27/19 2017)     ED Discharge Orders         Ordered    phenazopyridine (PYRIDIUM) 200 MG tablet  3 times daily with meals     03/28/19 0010    cephALEXin (KEFLEX) 500 MG capsule  3 times daily     03/28/19 0010    ondansetron (ZOFRAN ODT) 4 MG disintegrating tablet  Every 8 hours PRN     03/28/19 0010     ibuprofen (ADVIL)  600 MG tablet  Every 8 hours PRN     03/28/19 0010           Note:  This document was prepared using Dragon voice recognition software and may include unintentional dictation errors.   Duffy Bruce, MD 03/28/19 [phone]

## 2019-03-27 NOTE — ED Triage Notes (Signed)
Patient ambulatory to triage with steady gait, without difficulty or distress noted, mask in place; pt reports sudden onset sharp pain to pelvis with urinary frequency (st normally doesn't urinate everyday but denies any chronic kidney disease) and dysuria; pain to rt side radiating around into rt lower abd

## 2019-03-28 DIAGNOSIS — N3000 Acute cystitis without hematuria: Secondary | ICD-10-CM | POA: Diagnosis not present

## 2019-03-28 MED ORDER — ONDANSETRON 4 MG PO TBDP
4.0000 mg | ORAL_TABLET | Freq: Once | ORAL | Status: AC
  Administered 2019-03-28: 4 mg via ORAL
  Filled 2019-03-28: qty 1

## 2019-03-28 MED ORDER — ONDANSETRON 4 MG PO TBDP: 4.0000 mg | ORAL_TABLET | Freq: Three times a day (TID) | ORAL | 0 refills | Status: DC | PRN

## 2019-03-28 MED ORDER — PHENAZOPYRIDINE HCL 200 MG PO TABS: 200.0000 mg | ORAL_TABLET | Freq: Three times a day (TID) | ORAL | 0 refills | Status: AC

## 2019-03-28 MED ORDER — CEPHALEXIN 500 MG PO CAPS
500.0000 mg | ORAL_CAPSULE | Freq: Once | ORAL | Status: AC
  Administered 2019-03-28: 500 mg via ORAL
  Filled 2019-03-28: qty 1

## 2019-03-28 MED ORDER — CEPHALEXIN 500 MG PO CAPS: 500.0000 mg | ORAL_CAPSULE | Freq: Three times a day (TID) | ORAL | 0 refills | Status: AC

## 2019-03-28 MED ORDER — IBUPROFEN 600 MG PO TABS
600.0000 mg | ORAL_TABLET | Freq: Once | ORAL | Status: AC
  Administered 2019-03-28: 600 mg via ORAL
  Filled 2019-03-28: qty 1

## 2019-03-28 MED ORDER — IBUPROFEN 600 MG PO TABS: 600.0000 mg | ORAL_TABLET | Freq: Three times a day (TID) | ORAL | 0 refills | Status: DC | PRN

## 2019-03-30 LAB — URINE CULTURE: Culture: >=100000 — AB

## 2019-07-18 ENCOUNTER — Other Ambulatory Visit: Payer: Self-pay

## 2019-07-18 ENCOUNTER — Emergency Department: Payer: Medicare HMO

## 2019-07-18 ENCOUNTER — Encounter: Payer: Self-pay | Admitting: Emergency Medicine

## 2019-07-18 ENCOUNTER — Emergency Department
Admission: EM | Admit: 2019-07-18 | Discharge: 2019-07-18 | Disposition: A | Discharge status: Home / Self Care | Payer: Medicare HMO | Attending: Emergency Medicine | Admitting: Emergency Medicine

## 2019-07-18 DIAGNOSIS — Z8673 Personal history of transient ischemic attack (TIA), and cerebral infarction without residual deficits: Secondary | ICD-10-CM | POA: Insufficient documentation

## 2019-07-18 DIAGNOSIS — F17200 Nicotine dependence, unspecified, uncomplicated: Secondary | ICD-10-CM | POA: Insufficient documentation

## 2019-07-18 DIAGNOSIS — Z7984 Long term (current) use of oral hypoglycemic drugs: Secondary | ICD-10-CM | POA: Diagnosis not present

## 2019-07-18 DIAGNOSIS — R208 Other disturbances of skin sensation: Secondary | ICD-10-CM | POA: Diagnosis present

## 2019-07-18 DIAGNOSIS — Z79899 Other long term (current) drug therapy: Secondary | ICD-10-CM | POA: Insufficient documentation

## 2019-07-18 DIAGNOSIS — E119 Type 2 diabetes mellitus without complications: Secondary | ICD-10-CM | POA: Diagnosis not present

## 2019-07-18 DIAGNOSIS — Z7982 Long term (current) use of aspirin: Secondary | ICD-10-CM | POA: Insufficient documentation

## 2019-07-18 DIAGNOSIS — Z20822 Contact with and (suspected) exposure to covid-19: Secondary | ICD-10-CM

## 2019-07-18 DIAGNOSIS — R0981 Nasal congestion: Secondary | ICD-10-CM | POA: Diagnosis not present

## 2019-07-18 LAB — COMPREHENSIVE METABOLIC PANEL
ALT: 24 U/L (ref 0–44)
AST: 20 U/L (ref 15–41)
Albumin: 4.1 g/dL (ref 3.5–5.0)
Alkaline Phosphatase: 68 U/L (ref 38–126)
Anion gap: 10 (ref 5–15)
BUN: 15 mg/dL (ref 6–20)
CO2: 22 mmol/L (ref 22–32)
Calcium: 9.2 mg/dL (ref 8.9–10.3)
Chloride: 106 mmol/L (ref 98–111)
Creatinine, Ser: 0.97 mg/dL (ref 0.44–1.00)
GFR calc Af Amer: >60 mL/min (ref >60)
GFR calc non Af Amer: >60 mL/min (ref >60)
Glucose, Bld: 116 mg/dL — ABNORMAL HIGH (ref 70–99)
Potassium: 4.1 mmol/L (ref 3.5–5.1)
Sodium: 138 mmol/L (ref 135–145)
Total Bilirubin: 0.3 mg/dL (ref 0.3–1.2)
Total Protein: 7.6 g/dL (ref 6.5–8.1)

## 2019-07-18 LAB — URINALYSIS, COMPLETE (UACMP) WITH MICROSCOPIC
Bilirubin Urine: NEGATIVE
Glucose, UA: NEGATIVE mg/dL
Hgb urine dipstick: NEGATIVE
Ketones, ur: NEGATIVE mg/dL
Leukocytes,Ua: NEGATIVE
Nitrite: NEGATIVE
Protein, ur: NEGATIVE mg/dL
Specific Gravity, Urine: 1.015 (ref 1.005–1.030)
pH: 6 (ref 5.0–8.0)

## 2019-07-18 LAB — CBC WITH DIFFERENTIAL/PLATELET
Abs Immature Granulocytes: 0.02 10*3/uL (ref 0.00–0.07)
Basophils Absolute: 0 10*3/uL (ref 0.0–0.1)
Basophils Relative: 1 %
Eosinophils Absolute: 0.1 10*3/uL (ref 0.0–0.5)
Eosinophils Relative: 1 %
HCT: 40.6 % (ref 36.0–46.0)
Hemoglobin: 13.9 g/dL (ref 12.0–15.0)
Immature Granulocytes: 0 %
Lymphocytes Relative: 44 %
Lymphs Abs: 2.8 10*3/uL (ref 0.7–4.0)
MCH: 28.3 pg (ref 26.0–34.0)
MCHC: 34.2 g/dL (ref 30.0–36.0)
MCV: 82.7 fL (ref 80.0–100.0)
Monocytes Absolute: 0.6 10*3/uL (ref 0.1–1.0)
Monocytes Relative: 9 %
Neutro Abs: 3 10*3/uL (ref 1.7–7.7)
Neutrophils Relative %: 45 %
Platelets: 234 10*3/uL (ref 150–400)
RBC: 4.91 MIL/uL (ref 3.87–5.11)
RDW: 13.2 % (ref 11.5–15.5)
WBC: 6.5 10*3/uL (ref 4.0–10.5)
nRBC: 0 % (ref 0.0–0.2)

## 2019-07-18 LAB — GLUCOSE, CAPILLARY: Glucose-Capillary: 128 mg/dL — ABNORMAL HIGH (ref 70–99)

## 2019-07-18 LAB — URINE DRUG SCREEN, QUALITATIVE (ARMC ONLY)
Amphetamines, Ur Screen: NOT DETECTED
Barbiturates, Ur Screen: NOT DETECTED
Benzodiazepine, Ur Scrn: NOT DETECTED
Cannabinoid 50 Ng, Ur ~~LOC~~: NOT DETECTED
Cocaine Metabolite,Ur ~~LOC~~: NOT DETECTED
MDMA (Ecstasy)Ur Screen: NOT DETECTED
Methadone Scn, Ur: NOT DETECTED
Opiate, Ur Screen: NOT DETECTED
Phencyclidine (PCP) Ur S: NOT DETECTED
Tricyclic, Ur Screen: POSITIVE — AB

## 2019-07-18 LAB — ETHANOL: Alcohol, Ethyl (B): <10 mg/dL (ref <10)

## 2019-07-18 MED ORDER — HYDROXYZINE HCL 25 MG PO TABS: 25.0000 mg | ORAL_TABLET | Freq: Four times a day (QID) | ORAL | 0 refills | Status: DC | PRN

## 2019-07-18 NOTE — Discharge Instructions (Signed)
Follow-up with your doctor in [place].  A prescription for Atarax was sent to your pharmacy which should help with the burning sensation of your face and any itching.

## 2019-07-18 NOTE — ED Notes (Signed)
Discussed with dr Ellender Hose. No protocols and ok for flex.  Will check finger stick before going.

## 2019-07-18 NOTE — ED Notes (Signed)
Patient is AAOx3.  Skin warm and dry. NAD.  Patient c/o feeling sensations to right face at night and seeing 'laser scars' to right face at night since stroke in 2009.  Speech clear.  NAD.  Calm and cooperative.

## 2019-07-18 NOTE — ED Triage Notes (Addendum)
Pt here for "laser spots to my face.  They are there then they go away and come to other side of face then go away again". Denies pain. No fever or cough. No vision changes. Mild stuffy nose at times.  Unlabored. VSS.  Speech clear. No tingling or numbness.  Sx intermittent for one month.  No sore throat.  Also c/o dark circles under eyes.  Pt describes as a line across face that will then go away.  Is oriented.
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
## 2019-07-19 LAB — URINE CULTURE
Culture: <10000 — AB
Special Requests: NORMAL

## 2019-07-20 LAB — NOVEL CORONAVIRUS, NAA: SARS-CoV-2, NAA: NOT DETECTED

## 2019-08-21 DIAGNOSIS — D126 Benign neoplasm of colon, unspecified: Secondary | ICD-10-CM | POA: Insufficient documentation

## 2019-12-05 ENCOUNTER — Emergency Department
Admission: EM | Admit: 2019-12-05 | Discharge: 2019-12-05 | Disposition: A | Discharge status: Home / Self Care | Payer: Medicare HMO | Attending: Emergency Medicine | Admitting: Emergency Medicine

## 2019-12-05 ENCOUNTER — Emergency Department: Payer: Medicare HMO

## 2019-12-05 ENCOUNTER — Other Ambulatory Visit: Payer: Self-pay

## 2019-12-05 DIAGNOSIS — F17210 Nicotine dependence, cigarettes, uncomplicated: Secondary | ICD-10-CM | POA: Diagnosis not present

## 2019-12-05 DIAGNOSIS — Z79899 Other long term (current) drug therapy: Secondary | ICD-10-CM | POA: Diagnosis not present

## 2019-12-05 DIAGNOSIS — Z7982 Long term (current) use of aspirin: Secondary | ICD-10-CM | POA: Diagnosis not present

## 2019-12-05 DIAGNOSIS — M5441 Lumbago with sciatica, right side: Secondary | ICD-10-CM | POA: Diagnosis not present

## 2019-12-05 DIAGNOSIS — J45909 Unspecified asthma, uncomplicated: Secondary | ICD-10-CM | POA: Insufficient documentation

## 2019-12-05 DIAGNOSIS — Z7984 Long term (current) use of oral hypoglycemic drugs: Secondary | ICD-10-CM | POA: Insufficient documentation

## 2019-12-05 DIAGNOSIS — E119 Type 2 diabetes mellitus without complications: Secondary | ICD-10-CM | POA: Insufficient documentation

## 2019-12-05 DIAGNOSIS — R1031 Right lower quadrant pain: Secondary | ICD-10-CM

## 2019-12-05 DIAGNOSIS — M545 Low back pain: Secondary | ICD-10-CM | POA: Diagnosis present

## 2019-12-05 HISTORY — DX: Unspecified asthma, uncomplicated: J45.909

## 2019-12-05 LAB — URINALYSIS, COMPLETE (UACMP) WITH MICROSCOPIC
Bilirubin Urine: NEGATIVE
Glucose, UA: NEGATIVE mg/dL
Hgb urine dipstick: NEGATIVE
Ketones, ur: NEGATIVE mg/dL
Leukocytes,Ua: NEGATIVE
Nitrite: NEGATIVE
Protein, ur: NEGATIVE mg/dL
Specific Gravity, Urine: 1.01 (ref 1.005–1.030)
pH: 5 (ref 5.0–8.0)

## 2019-12-05 LAB — CBC WITH DIFFERENTIAL/PLATELET
Abs Immature Granulocytes: 0.02 10*3/uL (ref 0.00–0.07)
Basophils Absolute: 0 10*3/uL (ref 0.0–0.1)
Basophils Relative: 1 %
Eosinophils Absolute: 0.1 10*3/uL (ref 0.0–0.5)
Eosinophils Relative: 1 %
HCT: 37.9 % (ref 36.0–46.0)
Hemoglobin: 12.9 g/dL (ref 12.0–15.0)
Immature Granulocytes: 0 %
Lymphocytes Relative: 31 %
Lymphs Abs: 2.3 10*3/uL (ref 0.7–4.0)
MCH: 28.7 pg (ref 26.0–34.0)
MCHC: 34 g/dL (ref 30.0–36.0)
MCV: 84.4 fL (ref 80.0–100.0)
Monocytes Absolute: 0.6 10*3/uL (ref 0.1–1.0)
Monocytes Relative: 8 %
Neutro Abs: 4.4 10*3/uL (ref 1.7–7.7)
Neutrophils Relative %: 59 %
Platelets: 213 10*3/uL (ref 150–400)
RBC: 4.49 MIL/uL (ref 3.87–5.11)
RDW: 13.2 % (ref 11.5–15.5)
WBC: 7.3 10*3/uL (ref 4.0–10.5)
nRBC: 0 % (ref 0.0–0.2)

## 2019-12-05 LAB — COMPREHENSIVE METABOLIC PANEL
ALT: 24 U/L (ref 0–44)
AST: 18 U/L (ref 15–41)
Albumin: 3.7 g/dL (ref 3.5–5.0)
Alkaline Phosphatase: 68 U/L (ref 38–126)
Anion gap: 10 (ref 5–15)
BUN: 15 mg/dL (ref 6–20)
CO2: 22 mmol/L (ref 22–32)
Calcium: 9.2 mg/dL (ref 8.9–10.3)
Chloride: 105 mmol/L (ref 98–111)
Creatinine, Ser: 0.88 mg/dL (ref 0.44–1.00)
GFR calc Af Amer: >60 mL/min (ref >60)
GFR calc non Af Amer: >60 mL/min (ref >60)
Glucose, Bld: 177 mg/dL — ABNORMAL HIGH (ref 70–99)
Potassium: 4.1 mmol/L (ref 3.5–5.1)
Sodium: 137 mmol/L (ref 135–145)
Total Bilirubin: 0.4 mg/dL (ref 0.3–1.2)
Total Protein: 7.3 g/dL (ref 6.5–8.1)

## 2019-12-05 MED ORDER — CYCLOBENZAPRINE HCL 10 MG PO TABS: 10.0000 mg | ORAL_TABLET | Freq: Three times a day (TID) | ORAL | 0 refills | Status: DC | PRN

## 2019-12-05 MED ORDER — TRAMADOL HCL 50 MG PO TABS: 50.0000 mg | ORAL_TABLET | Freq: Four times a day (QID) | ORAL | 0 refills | Status: DC | PRN

## 2019-12-05 MED ORDER — MELOXICAM 15 MG PO TABS: 15.0000 mg | ORAL_TABLET | Freq: Every day | ORAL | 2 refills | Status: AC

## 2019-12-05 MED ORDER — MORPHINE SULFATE (PF) 4 MG/ML IV SOLN
4.0000 mg | Freq: Once | INTRAVENOUS | Status: AC
  Administered 2019-12-05: 4 mg via INTRAVENOUS
  Filled 2019-12-05: qty 1

## 2019-12-05 MED ORDER — IOHEXOL 300 MG/ML  SOLN
100.0000 mL | Freq: Once | INTRAMUSCULAR | Status: AC | PRN
  Administered 2019-12-05: 100 mL via INTRAVENOUS
  Filled 2019-12-05: qty 100

## 2019-12-05 MED ORDER — ONDANSETRON HCL 4 MG/2ML IJ SOLN
4.0000 mg | Freq: Once | INTRAMUSCULAR | Status: AC
  Administered 2019-12-05: 4 mg via INTRAVENOUS
  Filled 2019-12-05: qty 2

## 2019-12-05 NOTE — ED Notes (Signed)
See triage note  Presents with lower back pain which started yesterday  States pain in moving into both hips

## 2019-12-05 NOTE — ED Provider Notes (Signed)
[facility] Emergency Department Provider Note  ____________________________________________   First MD Initiated Contact with Patient 12/05/19 1209     (approximate)  I have reviewed the triage vital signs and the nursing notes.   HISTORY  Chief Complaint Leg Pain and Back Pain    HPI Kathleen Gilmore is a 59 y.o. female presents emergency department complaining of low back pain and right lower quadrant pain.  Patient states she still has her appendix and the pain feels like her ovary is hurting.  She has history of chronic back problems.  No fever or chills.  No chest pain shortness of breath.  No vomiting or diarrhea.    Past Medical History:  Diagnosis Date  . Anxiety   . Anxiety   . Asthma   . Diabetes mellitus without complication ([place])   . Stroke [facility])     There are no problems to display for this patient.   Past Surgical History:  Procedure Laterality Date  . KNEE SURGERY      Prior to Admission medications   Medication Sig Start Date End Date Taking? Authorizing Provider  albuterol (PROVENTIL HFA;VENTOLIN HFA) 108 (90 BASE) MCG/ACT inhaler Inhale 2 puffs into the lungs 2 (two) times daily as needed for wheezing.    [provider]  aspirin EC 81 MG tablet Take 81 mg by mouth daily.    [provider]  budesonide-formoterol (SYMBICORT) 160-4.5 MCG/ACT inhaler Inhale 2 puffs into the lungs 2 (two) times daily.    [provider]  clonazePAM (KLONOPIN) 0.5 MG tablet Take 0.5 mg by mouth 2 (two) times daily.    [provider]  cyclobenzaprine (FLEXERIL) 10 MG tablet Take 1 tablet (10 mg total) by mouth 3 (three) times daily as needed. 12/05/19   Gerrod Maule, Linden Dolin, PA-C  meloxicam (MOBIC) 15 MG tablet Take 1 tablet (15 mg total) by mouth daily. 12/05/19 12/04/20  Adrionna Delcid, Linden Dolin, PA-C  metFORMIN (GLUCOPHAGE) 500 MG tablet Take 500 mg by mouth 2 (two) times daily with a meal.    [provider]    simvastatin (ZOCOR) 40 MG tablet Take 20 mg by mouth daily.    [provider]  traMADol (ULTRAM) 50 MG tablet Take 1 tablet (50 mg total) by mouth every 6 (six) hours as needed. 12/05/19   Jamus Loving, Linden Dolin, PA-C  traZODone (DESYREL) 100 MG tablet Take 1 tablet (100 mg total) by mouth 2 (two) times daily. 09/04/17 12/05/19  Merlyn Lot, MD    Allergies Patient has no known allergies.  History reviewed. No pertinent family history.  Social History Social History   Tobacco Use  . Smoking status: Current Every Day Smoker  . Smokeless tobacco: Never Used  Substance Use Topics  . Alcohol use: No  . Drug use: No    Review of Systems  Constitutional: No fever/chills Eyes: No visual changes. ENT: No sore throat. Respiratory: Denies cough Cardiovascular: Denies chest pain Gastrointestinal: Positive abdominal pain Genitourinary: Negative for dysuria. Musculoskeletal: Positive for back pain. Skin: Negative for rash. Psychiatric: no mood changes,     ____________________________________________   PHYSICAL EXAM:  VITAL SIGNS: ED Triage Vitals [12/05/19 1154]  Enc Vitals Group     BP 114/63     Pulse Rate 82     Resp 18     Temp 98.2 F (36.8 C)     Temp src      SpO2 99 %     Weight 218 lb (98.9 kg)  Height 5\' 7"  (1.702 m)     Head Circumference      Peak Flow      Pain Score 10     Pain Loc      Pain Edu?      Excl. in [place]?     Constitutional: Alert and oriented. Well appearing and in no acute distress. Eyes: Conjunctivae are normal.  Head: Atraumatic. Nose: No congestion/rhinnorhea. Mouth/Throat: Mucous membranes are moist.   Neck:  supple no lymphadenopathy noted Cardiovascular: Normal rate, regular rhythm. Heart sounds are normal Respiratory: Normal respiratory effort.  No retractions, lungs c t a  Abd: soft tender in the right lower quadrant, bs normal all 4 quad GU: deferred Musculoskeletal: FROM all extremities, warm and well perfused,  lumbar spine is mildly tender to palpation, muscles are spasmed in lower back, patient appears to be quite uncomfortable Neurologic:  Normal speech and language.  Skin:  Skin is warm, dry and intact. No rash noted. Psychiatric: Mood and affect are normal. Speech and behavior are normal.  ____________________________________________   LABS (all labs ordered are listed, but only abnormal results are displayed)  Labs Reviewed  COMPREHENSIVE METABOLIC PANEL - Abnormal; Notable for the following components:      Result Value   Glucose, Bld 177 (*)    All other components within normal limits  URINALYSIS, COMPLETE (UACMP) WITH MICROSCOPIC - Abnormal; Notable for the following components:   Color, Urine YELLOW (*)    APPearance HAZY (*)    Bacteria, UA MANY (*)    All other components within normal limits  CBC WITH DIFFERENTIAL/PLATELET   ____________________________________________   ____________________________________________  RADIOLOGY  CT abdomen/pelvis with IV does not show appendicitis.  Does show degenerative disc disease.  ____________________________________________   PROCEDURES  Procedure(s) performed: No  Procedures    ____________________________________________   INITIAL IMPRESSION / ASSESSMENT AND PLAN / ED COURSE  Pertinent labs & imaging results that were available during my care of the patient were reviewed by me and considered in my medical decision making (see chart for details).   Patient is a 59 year old female emergency department with low back pain and right lower quadrant pain.  See HPI  Physical exam shows patient to appear well.  Lumbar spine is mildly tender, right lower quadrant is tender.  Plain exam is unremarkable  DDx: Degenerative disease, lumbar radiculopathy, acute appendicitis  CBC is normal, comprehensive metabolic panel is normal, urinalysis is normal  Explained findings to the patient.  Due to the abdominal pain we did agree  that a CT would be appropriate at this time.  CT abdomen/pelvis with IV contrast was negative for acute appendicitis.  Did show degenerative disc disease.    Patient had been given morphine while here in the ED.  She states she is feeling better.  She was given a prescription for Flexeril, meloxicam, and tramadol.  She is to follow-up with her regular doctor if not improving in 3 days.  Return emergency department worsening.  States she understands will comply.  She is discharged stable condition.    Kathleen Gilmore was evaluated in Emergency Department on 12/05/2019 for the symptoms described in the history of present illness. She was evaluated in the context of the global COVID-19 pandemic, which necessitated consideration that the patient might be at risk for infection with the SARS-CoV-2 virus that causes COVID-19. Institutional protocols and algorithms that pertain to the evaluation of patients at risk for COVID-19 are in a state of rapid change based on  information released by regulatory bodies including the CDC and federal and state organizations. These policies and algorithms were followed during the patient's care in the ED.   As part of my medical decision making, I reviewed the following data within the [place] notes reviewed and incorporated, Labs reviewed , Old chart reviewed, Radiograph reviewed , Notes from prior ED visits and [place] Controlled Substance Database  ____________________________________________   FINAL CLINICAL IMPRESSION(S) / ED DIAGNOSES  Final diagnoses:  Acute midline low back pain with right-sided sciatica  RLQ abdominal pain      NEW MEDICATIONS STARTED DURING THIS VISIT:  Discharge Medication List as of 12/05/2019  2:37 PM    START taking these medications   Details  meloxicam (MOBIC) 15 MG tablet Take 1 tablet (15 mg total) by mouth daily., Starting Wed 12/05/2019, Until Thu 12/04/2020, Normal    traMADol (ULTRAM) 50 MG tablet Take  1 tablet (50 mg total) by mouth every 6 (six) hours as needed., Starting Wed 12/05/2019, Normal         Note:  This document was prepared using Dragon voice recognition software and may include unintentional dictation errors.    Versie Starks, PA-C 12/05/19 1549    Lavonia Drafts, MD 12/06/19 [phone]

## 2019-12-05 NOTE — ED Triage Notes (Signed)
Pt comes via POV from home with c/o right lower back and leg pain. Pt states it hurt down her back and to her leg  Pt denies any recent injuries.

## 2019-12-05 NOTE — Discharge Instructions (Addendum)
Follow-up with your regular doctor if not improving in 3 days.  Return emergency department worsening.  Take your medication as prescribed.

## 2020-04-03 ENCOUNTER — Emergency Department: Payer: Medicare HMO

## 2020-04-03 ENCOUNTER — Other Ambulatory Visit: Payer: Self-pay

## 2020-04-03 ENCOUNTER — Emergency Department
Admission: EM | Admit: 2020-04-03 | Discharge: 2020-04-03 | Disposition: A | Discharge status: Home / Self Care | Payer: Medicare HMO | Attending: Emergency Medicine | Admitting: Emergency Medicine

## 2020-04-03 ENCOUNTER — Encounter: Payer: Self-pay | Admitting: Intensive Care

## 2020-04-03 DIAGNOSIS — Z7982 Long term (current) use of aspirin: Secondary | ICD-10-CM | POA: Diagnosis not present

## 2020-04-03 DIAGNOSIS — R109 Unspecified abdominal pain: Secondary | ICD-10-CM | POA: Insufficient documentation

## 2020-04-03 DIAGNOSIS — F17210 Nicotine dependence, cigarettes, uncomplicated: Secondary | ICD-10-CM | POA: Diagnosis not present

## 2020-04-03 DIAGNOSIS — Z7984 Long term (current) use of oral hypoglycemic drugs: Secondary | ICD-10-CM | POA: Insufficient documentation

## 2020-04-03 DIAGNOSIS — J45909 Unspecified asthma, uncomplicated: Secondary | ICD-10-CM | POA: Diagnosis not present

## 2020-04-03 DIAGNOSIS — E119 Type 2 diabetes mellitus without complications: Secondary | ICD-10-CM | POA: Diagnosis not present

## 2020-04-03 DIAGNOSIS — R079 Chest pain, unspecified: Secondary | ICD-10-CM | POA: Insufficient documentation

## 2020-04-03 DIAGNOSIS — Z20822 Contact with and (suspected) exposure to covid-19: Secondary | ICD-10-CM | POA: Diagnosis not present

## 2020-04-03 DIAGNOSIS — Z7951 Long term (current) use of inhaled steroids: Secondary | ICD-10-CM | POA: Diagnosis not present

## 2020-04-03 DIAGNOSIS — R0789 Other chest pain: Secondary | ICD-10-CM

## 2020-04-03 LAB — BASIC METABOLIC PANEL
Anion gap: 10 (ref 5–15)
BUN: 14 mg/dL (ref 6–20)
CO2: 23 mmol/L (ref 22–32)
Calcium: 9.2 mg/dL (ref 8.9–10.3)
Chloride: 106 mmol/L (ref 98–111)
Creatinine, Ser: 0.88 mg/dL (ref 0.44–1.00)
GFR calc Af Amer: >60 mL/min (ref >60)
GFR calc non Af Amer: >60 mL/min (ref >60)
Glucose, Bld: 117 mg/dL — ABNORMAL HIGH (ref 70–99)
Potassium: 3.9 mmol/L (ref 3.5–5.1)
Sodium: 139 mmol/L (ref 135–145)

## 2020-04-03 LAB — CBC
HCT: 40.1 % (ref 36.0–46.0)
Hemoglobin: 13.3 g/dL (ref 12.0–15.0)
MCH: 28.3 pg (ref 26.0–34.0)
MCHC: 33.2 g/dL (ref 30.0–36.0)
MCV: 85.3 fL (ref 80.0–100.0)
Platelets: 235 10*3/uL (ref 150–400)
RBC: 4.7 MIL/uL (ref 3.87–5.11)
RDW: 13.8 % (ref 11.5–15.5)
WBC: 6.3 10*3/uL (ref 4.0–10.5)
nRBC: 0 % (ref 0.0–0.2)

## 2020-04-03 LAB — HEPATIC FUNCTION PANEL
ALT: 21 U/L (ref 0–44)
AST: 23 U/L (ref 15–41)
Albumin: 4.1 g/dL (ref 3.5–5.0)
Alkaline Phosphatase: 69 U/L (ref 38–126)
Bilirubin, Direct: 0.1 mg/dL (ref 0.0–0.2)
Indirect Bilirubin: 0.4 mg/dL (ref 0.3–0.9)
Total Bilirubin: 0.5 mg/dL (ref 0.3–1.2)
Total Protein: 7.7 g/dL (ref 6.5–8.1)

## 2020-04-03 LAB — TROPONIN I (HIGH SENSITIVITY)
Troponin I (High Sensitivity): <2 ng/L (ref <18)
Troponin I (High Sensitivity): <2 ng/L (ref <18)

## 2020-04-03 LAB — SARS CORONAVIRUS 2 BY RT PCR (HOSPITAL ORDER, PERFORMED IN ~~LOC~~ HOSPITAL LAB): SARS Coronavirus 2: NEGATIVE

## 2020-04-03 LAB — LIPASE, BLOOD: Lipase: 30 U/L (ref 11–51)

## 2020-04-03 MED ORDER — IOHEXOL 350 MG/ML SOLN
75.0000 mL | Freq: Once | INTRAVENOUS | Status: AC | PRN
  Administered 2020-04-03: 75 mL via INTRAVENOUS
  Filled 2020-04-03: qty 75

## 2020-04-03 NOTE — ED Provider Notes (Signed)
Emergency Department Provider Note  ____________________________________________  Time seen: Approximately 5:42 PM  I have reviewed the triage vital signs and the nursing notes.   HISTORY  Chief Complaint Chest Pain, Abdominal Pain, and Diarrhea   Historian Patient     HPI Kathleen Gilmore is a 59 y.o. female with a history of CVA, diabetes and daily smoking, presents for evaluation of chest pain.  Patient states that chest pain started around 3:00 AM and has not improved since it started.  Patient states that chest pain is associated with cramping abdominal discomfort and patient has had diarrhea recently.  The patient's chest pain is described as heaviness/pressure/tightness and is not worse with exertion. The patient's chest pain is not middle- or left-sided, is not well-localized, is not sharp and does not radiate to the arms/jaw/neck. The patient does not complain of nausea and denies diaphoresis. The patient has smoked in the past 90 days. The patient has no history of peripheral artery disease, has no relevant family history of coronary artery disease (first degree relative at less than age 82), is not hypertensive and has no history of hypercholesterolemia to her knowledge.  She has been afebrile at home with no associated rhinorrhea, nasal congestion or nonproductive cough.  She states that she has been around "a lot of people" and has potential sick contacts for COVID-19.    Past Medical History:  Diagnosis Date   Anxiety    Anxiety    Asthma    Diabetes mellitus without complication ([place])    Stroke ([place])      Immunizations up to date:  Yes.     Past Medical History:  Diagnosis Date   Anxiety    Anxiety    Asthma    Diabetes mellitus without complication ([place])    Stroke ([place])     There are no problems to display for this patient.   Past Surgical History:  Procedure Laterality Date   KNEE SURGERY      Prior to Admission medications    Medication Sig Start Date End Date Taking? Authorizing Provider  albuterol (PROVENTIL HFA;VENTOLIN HFA) 108 (90 BASE) MCG/ACT inhaler Inhale 2 puffs into the lungs 2 (two) times daily as needed for wheezing.    [provider]  aspirin EC 81 MG tablet Take 81 mg by mouth daily.    [provider]  budesonide-formoterol (SYMBICORT) 160-4.5 MCG/ACT inhaler Inhale 2 puffs into the lungs 2 (two) times daily.    [provider]  clonazePAM (KLONOPIN) 0.5 MG tablet Take 0.5 mg by mouth 2 (two) times daily.    [provider]  cyclobenzaprine (FLEXERIL) 10 MG tablet Take 1 tablet (10 mg total) by mouth 3 (three) times daily as needed. 12/05/19   Fisher, Linden Dolin, PA-C  meloxicam (MOBIC) 15 MG tablet Take 1 tablet (15 mg total) by mouth daily. 12/05/19 12/04/20  Fisher, Linden Dolin, PA-C  metFORMIN (GLUCOPHAGE) 500 MG tablet Take 500 mg by mouth 2 (two) times daily with a meal.    [provider]  simvastatin (ZOCOR) 40 MG tablet Take 20 mg by mouth daily.    [provider]  traMADol (ULTRAM) 50 MG tablet Take 1 tablet (50 mg total) by mouth every 6 (six) hours as needed. 12/05/19   Fisher, Linden Dolin, PA-C  traZODone (DESYREL) 100 MG tablet Take 1 tablet (100 mg total) by mouth 2 (two) times daily. 09/04/17 12/05/19  Merlyn Lot, MD    Allergies Patient has no known allergies.  History  reviewed. No pertinent family history.  Social History Social History   Tobacco Use   Smoking status: Current Every Day Smoker    Types: Cigarettes   Smokeless tobacco: Never Used  Substance Use Topics   Alcohol use: No   Drug use: No     Review of Systems  Constitutional: No fever/chills Eyes:  No discharge ENT: No upper respiratory complaints. Respiratory: no cough. No SOB/ use of accessory muscles to breath Gastrointestinal:   No nausea, no vomiting.  No diarrhea.  No constipation. Cardiac: Patient has chest tightness.  Musculoskeletal: Negative for  musculoskeletal pain. Skin: Negative for rash, abrasions, lacerations, ecchymosis.    ____________________________________________   PHYSICAL EXAM:  VITAL SIGNS: ED Triage Vitals  Enc Vitals Group     BP 04/03/20 1546 (!) 114/42     Pulse Rate 04/03/20 1546 62     Resp 04/03/20 1546 20     Temp 04/03/20 1546 98.7 F (37.1 C)     Temp Source 04/03/20 1546 Tympanic     SpO2 04/03/20 1546 99 %     Weight 04/03/20 1546 209 lb (94.8 kg)     Height 04/03/20 1554 5\' 7"  (1.702 m)     Head Circumference --      Peak Flow --      Pain Score 04/03/20 1554 7     Pain Loc --      Pain Edu? --      Excl. in [place]? --      Constitutional: Alert and oriented. Well appearing and in no acute distress. Eyes: Conjunctivae are normal. PERRL. EOMI. Head: Atraumatic. ENT:      Nose: No congestion/rhinnorhea.      Mouth/Throat: Mucous membranes are moist.  Neck: No stridor.  No cervical spine tenderness to palpation. Cardiovascular: Normal rate, regular rhythm. Normal S1 and S2.  Good peripheral circulation. Respiratory: Normal respiratory effort without tachypnea or retractions. Lungs CTAB. Good air entry to the bases with no decreased or absent breath sounds Gastrointestinal: Bowel sounds x 4 quadrants. Soft and nontender to palpation. No guarding or rigidity. No distention. Musculoskeletal: Full range of motion to all extremities. No obvious deformities noted Neurologic:  Normal for age. No gross focal neurologic deficits are appreciated.  Skin:  Skin is warm, dry and intact. No rash noted. Psychiatric: Mood and affect are normal for age. Speech and behavior are normal.   ____________________________________________   LABS (all labs ordered are listed, but only abnormal results are displayed)  Labs Reviewed  BASIC METABOLIC PANEL - Abnormal; Notable for the following components:      Result Value   Glucose, Bld 117 (*)    All other components within normal limits  SARS CORONAVIRUS 2  BY RT PCR (HOSPITAL ORDER, [facility])  CBC  HEPATIC FUNCTION PANEL  LIPASE, BLOOD  TROPONIN I (HIGH SENSITIVITY)  TROPONIN I (HIGH SENSITIVITY)   ____________________________________________  EKG   ____________________________________________  RADIOLOGY Unk Pinto, personally viewed and evaluated these images (plain radiographs) as part of my medical decision making, as well as reviewing the written report by the radiologist.  DG Chest 2 View  Result Date: 04/03/2020 CLINICAL DATA:  Chest pain.  Abdominal pain. EXAM: CHEST - 2 VIEW COMPARISON:  09/04/2017 FINDINGS: The cardiomediastinal contours are normal. Mild bronchial thickening. Pulmonary vasculature is normal. No consolidation, pleural effusion, or pneumothorax. No acute osseous abnormalities are seen. IMPRESSION: Mild bronchial thickening which may be related to asthma, bronchitis or smoking.  Electronically Signed   By: Keith Rake M.D.   On: 04/03/2020 16:23   CT Angio Chest PE W and/or Wo Contrast  Result Date: 04/03/2020 CLINICAL DATA:  59 year old female with concern for pulmonary embolism. EXAM: CT ANGIOGRAPHY CHEST CT ABDOMEN AND PELVIS WITH CONTRAST TECHNIQUE: Multidetector CT imaging of the chest was performed using the standard protocol during bolus administration of intravenous contrast. Multiplanar CT image reconstructions and MIPs were obtained to evaluate the vascular anatomy. Multidetector CT imaging of the abdomen and pelvis was performed using the standard protocol during bolus administration of intravenous contrast. CONTRAST:  18mL OMNIPAQUE IOHEXOL 350 MG/ML SOLN COMPARISON:  CT abdomen pelvis dated 12/05/2019. chest radiograph dated 04/03/2020. FINDINGS: CTA CHEST FINDINGS Cardiovascular: There is no cardiomegaly or pericardial effusion. There is retrograde flow of contrast from the right atrium into the IVC suggestive of a degree of right heart dysfunction. Echocardiogram may  provide better evaluation. There is mild atherosclerotic calcification of the thoracic aorta. No aneurysmal dilatation. No pulmonary artery embolus identified. Mediastinum/Nodes: There is no hilar adenopathy. Subcarinal lymph node measures 11 mm in short axis. The esophagus and the thyroid gland are grossly unremarkable. No mediastinal fluid collection. Lungs/Pleura: There is mild centrilobular emphysema. There are minimal bibasilar atelectasis. No focal consolidation, pleural effusion, or pneumothorax. There is a 4 mm subpleural nodule in the superior segment of the left lower lobe. The central airways are patent. Musculoskeletal: No chest wall abnormality. No acute or significant osseous findings. Review of the MIP images confirms the above findings. CT ABDOMEN and PELVIS FINDINGS No intra-abdominal free air or free fluid. Hepatobiliary: Diffuse fatty liver. No intrahepatic biliary ductal dilatation. No calcified gallstone or pericholecystic fluid. Pancreas: Unremarkable. No pancreatic ductal dilatation or surrounding inflammatory changes. Spleen: Normal in size without focal abnormality. Adrenals/Urinary Tract: The adrenal glands unremarkable. There is no hydronephrosis on either side. There is symmetric enhancement and excretion of contrast by both kidneys. The visualized ureters and urinary bladder appear unremarkable. 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 There is no bowel obstruction or active inflammation. Normal appendix. Vascular/Lymphatic: Moderate aortoiliac atherosclerotic disease. The IVC is unremarkable. No portal venous gas. There is no adenopathy. Reproductive: The uterus is anteverted and grossly unremarkable. No adnexal masses. Other: None Musculoskeletal: No acute or significant osseous findings. Review of the MIP images confirms the above findings. IMPRESSION: 1. No acute intrathoracic, abdominal, or pelvic pathology. No CT evidence of pulmonary artery embolus. 2. Fatty  liver. 3. Congenital malrotation of the bowel. No bowel obstruction. Normal appendix. 4. Aortic Atherosclerosis (ICD10-I70.0) and Emphysema (ICD10-J43.9). Electronically Signed   By: Anner Crete M.D.   On: 04/03/2020 20:16   CT ABDOMEN PELVIS W CONTRAST  Result Date: 04/03/2020 CLINICAL DATA:  59 year old female with concern for pulmonary embolism. EXAM: CT ANGIOGRAPHY CHEST CT ABDOMEN AND PELVIS WITH CONTRAST TECHNIQUE: Multidetector CT imaging of the chest was performed using the standard protocol during bolus administration of intravenous contrast. Multiplanar CT image reconstructions and MIPs were obtained to evaluate the vascular anatomy. Multidetector CT imaging of the abdomen and pelvis was performed using the standard protocol during bolus administration of intravenous contrast. CONTRAST:  35mL OMNIPAQUE IOHEXOL 350 MG/ML SOLN COMPARISON:  CT abdomen pelvis dated 12/05/2019. chest radiograph dated 04/03/2020. FINDINGS: CTA CHEST FINDINGS Cardiovascular: There is no cardiomegaly or pericardial effusion. There is retrograde flow of contrast from the right atrium into the IVC suggestive of a degree of right heart dysfunction. Echocardiogram may provide better evaluation. There is mild atherosclerotic calcification of  the thoracic aorta. No aneurysmal dilatation. No pulmonary artery embolus identified. Mediastinum/Nodes: There is no hilar adenopathy. Subcarinal lymph node measures 11 mm in short axis. The esophagus and the thyroid gland are grossly unremarkable. No mediastinal fluid collection. Lungs/Pleura: There is mild centrilobular emphysema. There are minimal bibasilar atelectasis. No focal consolidation, pleural effusion, or pneumothorax. There is a 4 mm subpleural nodule in the superior segment of the left lower lobe. The central airways are patent. Musculoskeletal: No chest wall abnormality. No acute or significant osseous findings. Review of the MIP images confirms the above findings. CT  ABDOMEN and PELVIS FINDINGS No intra-abdominal free air or free fluid. Hepatobiliary: Diffuse fatty liver. No intrahepatic biliary ductal dilatation. No calcified gallstone or pericholecystic fluid. Pancreas: Unremarkable. No pancreatic ductal dilatation or surrounding inflammatory changes. Spleen: Normal in size without focal abnormality. Adrenals/Urinary Tract: The adrenal glands unremarkable. There is no hydronephrosis on either side. There is symmetric enhancement and excretion of contrast by both kidneys. The visualized ureters and urinary bladder appear unremarkable. 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 There is no bowel obstruction or active inflammation. Normal appendix. Vascular/Lymphatic: Moderate aortoiliac atherosclerotic disease. The IVC is unremarkable. No portal venous gas. There is no adenopathy. Reproductive: The uterus is anteverted and grossly unremarkable. No adnexal masses. Other: None Musculoskeletal: No acute or significant osseous findings. Review of the MIP images confirms the above findings. IMPRESSION: 1. No acute intrathoracic, abdominal, or pelvic pathology. No CT evidence of pulmonary artery embolus. 2. Fatty liver. 3. Congenital malrotation of the bowel. No bowel obstruction. Normal appendix. 4. Aortic Atherosclerosis (ICD10-I70.0) and Emphysema (ICD10-J43.9). Electronically Signed   By: Anner Crete M.D.   On: 04/03/2020 20:16    ____________________________________________    PROCEDURES  Procedure(s) performed:     Procedures     Medications  iohexol (OMNIPAQUE) 350 MG/ML injection 75 mL (75 mLs Intravenous Contrast Given 04/03/20 1945)     ____________________________________________   INITIAL IMPRESSION / ASSESSMENT AND PLAN / ED COURSE  Pertinent labs & imaging results that were available during my care of the patient were reviewed by me and considered in my medical decision making (see chart for details).    Assessment and  Plan:  Chest tightness Abdominal discomfort 59 year old female presents to the emergency department with chest tightness and pressure that started at 3:00 AM as well as cramping abdominal pain.  Vital signs were reassuring at triage.  On physical exam, patient appeared to be resting comfortably with no increased work of breathing.  No adventitious lung sounds were auscultated.  Abdomen was soft and diffusely tender without guarding.  Differential diagnosis includes PE, COVID-19, unspecified gastroenteritis, anxiety...  CTA reveals no evidence of PE. COVID-19 testing was negative. CT of the abdomen and pelvis revealed no acute abnormality. EKG revealed sinus bradycardia with no other arrhythmia. Both sets of troponin were within reference range.  Patient was advised to follow-up with primary care as needed. Return precautions were given to return with new or worsening symptoms.   ____________________________________________  FINAL CLINICAL IMPRESSION(S) / ED DIAGNOSES  Final diagnoses:  Chest tightness  Abdominal discomfort      NEW MEDICATIONS STARTED DURING THIS VISIT:  ED Discharge Orders    None          This chart was dictated using voice recognition software/Dragon. Despite best efforts to proofread, errors can occur which can change the meaning. Any change was purely unintentional.     Lannie Fields, PA-C 04/03/20 2101  Duffy Bruce, MD 04/08/20 [phone]

## 2020-04-03 NOTE — ED Notes (Signed)
Patient transported to X-ray 

## 2020-04-03 NOTE — ED Triage Notes (Signed)
Pt c/o sharp/tight chest pain under both breasts.Also reports sharp abdominal pain, and diarrhea for a few days. Hx stroke that she reports left deficits to left side. Able to ambulate into triage.

## 2020-04-03 NOTE — ED Notes (Signed)
Pt left ama before this Rn could obtain patient vs or signature. Pt previously mentioned needing to get home before dark, this Rn explained importance of staying to see scan results. Pt agreed to stay a little longer but is no longer present.

## 2020-08-22 ENCOUNTER — Other Ambulatory Visit (HOSPITAL_COMMUNITY)
Admission: RE | Admit: 2020-08-22 | Discharge: 2020-08-22 | Disposition: A | Discharge status: Home / Self Care | Payer: Medicare HMO | Source: Ambulatory Visit | Attending: Obstetrics & Gynecology | Admitting: Obstetrics & Gynecology

## 2020-08-22 ENCOUNTER — Ambulatory Visit (INDEPENDENT_AMBULATORY_CARE_PROVIDER_SITE_OTHER): Payer: Medicare HMO | Admitting: Obstetrics & Gynecology

## 2020-08-22 ENCOUNTER — Other Ambulatory Visit: Payer: Self-pay

## 2020-08-22 ENCOUNTER — Encounter: Payer: Self-pay | Admitting: Obstetrics & Gynecology

## 2020-08-22 VITALS — BP 108/60 | Ht 67.0 in | Wt 212.0 lb

## 2020-08-22 DIAGNOSIS — R1031 Right lower quadrant pain: Secondary | ICD-10-CM

## 2020-08-22 DIAGNOSIS — Z124 Encounter for screening for malignant neoplasm of cervix: Secondary | ICD-10-CM | POA: Insufficient documentation

## 2020-08-22 DIAGNOSIS — N814 Uterovaginal prolapse, unspecified: Secondary | ICD-10-CM | POA: Insufficient documentation

## 2020-08-22 LAB — POCT URINALYSIS DIPSTICK
Blood, UA: NEGATIVE
Ketones, UA: POSITIVE
Leukocytes, UA: NEGATIVE
Protein, UA: POSITIVE — AB

## 2020-08-22 NOTE — Patient Instructions (Signed)
Thank you for choosing [facility]. As part of our ongoing efforts to improve patient experience, we would appreciate your feedback. Please fill out the short survey that you will receive by mail or MyChart. Your opinion is important to [place]! -Dr Kenton Kingfisher  Pelvic Organ Prolapse Pelvic organ prolapse is a condition in women that involves the stretching, bulging, or dropping of pelvic organs into an abnormal position, past the opening of the vagina. It happens when the muscles and tissues that surround and support pelvic structures become weak or stretched. Pelvic organ prolapse can involve the:  Vagina (vaginal prolapse).  Uterus (uterine prolapse).  Bladder (cystocele).  Rectum (rectocele).  Intestines (enterocele). When organs other than the vagina are involved, they often bulge into the vagina or protrude from the vagina, depending on how severe the prolapse is. What are the causes? This condition may be caused by:  Pregnancy, labor, and childbirth.  Past pelvic surgery.  Lower levels of the hormone estrogen due to menopause.  Consistently lifting more than 50 lb (23 kg).  Obesity.  Long-term difficulty passing stool (chronic constipation).  Long-term, or chronic, cough.  Fluid buildup in the abdomen due to certain conditions. What are the signs or symptoms? Symptoms of this condition include:  Leaking a little urine (loss of bladder control) when you cough, sneeze, strain, and exercise (stress incontinence). This may be worse immediately after childbirth. It may gradually improve over time.  Feeling pressure in your pelvis or vagina. This pressure may increase when you cough or when you are passing stool.  A bulge that protrudes from the opening of your vagina.  Difficulty passing urine or stool.  Pain in your lower back.  Pain or discomfort during sex, or decreased interest in sex.  Repeated bladder infections (urinary tract infections).  Difficulty inserting a  tampon. In some people, this condition causes no symptoms. How is this diagnosed? This condition may be diagnosed based on a vaginal and rectal exam. During the exam, you may be asked to cough and strain while you are lying down, sitting, and standing up. Your health care provider will determine if other tests are required, such as bladder function tests. How is this treated? Treatment for this condition may depend on your symptoms. Treatment may include:  Lifestyle changes, such as drinking plenty of fluids and eating foods that are high in fiber.  Emptying your bladder at scheduled times (bladder training therapy). This can help reduce or avoid urinary incontinence.  Estrogen. This may help mild prolapse by increasing the strength and tone of pelvic floor muscles.  Kegel exercises. These may help mild cases of prolapse by strengthening and tightening the muscles of the pelvic floor.  A soft, flexible device that helps support the vaginal walls and keep pelvic organs in place (pessary). This is inserted into your vagina by your health care provider.  Surgery. This is often the only form of treatment for severe prolapse. Follow these instructions at home: Eating and drinking  Avoid drinking beverages that contain caffeine or alcohol.  Increase your intake of high-fiber foods to decrease constipation and straining during bowel movements. Activity  Lose weight if recommended by your health care provider.  Avoid heavy lifting and straining with exercise and work. Do not hold your breath when you perform mild to moderate lifting and exercise activities. Limit your activities as directed by your health care provider.  Do Kegel exercises as directed by your health care provider. To do this: ? Squeeze your pelvic floor  muscles tight. You should feel a tight lift in your rectal area and a tightness in your vaginal area. Keep your stomach, buttocks, and legs relaxed. ? Hold the muscles tight  for up to 10 seconds. Then relax your muscles. ? Repeat this exercise 50 times a day, or as much as told by your health care provider. Continue to do this exercise for at least 4-6 weeks, or for as long as told by your health care provider. General instructions  Take over-the-counter and prescription medicines only as told by your health care provider.  Wear a sanitary pad or adult diapers if you have urinary incontinence.  If you have a pessary, take care of it as told by your health care provider.  Keep all follow-up visits. This is important. Contact a health care provider if you:  Have symptoms that interfere with your daily activities or sex life.  Need medicine to help with the discomfort.  Notice bleeding from your vagina that is not related to your menstrual period.  Have a fever.  Have pain or bleeding when you urinate.  Have bleeding when you pass stool.  Pass urine when you have sex.  Have chronic constipation.  Have a pessary that falls out.  Have a foul-smelling vaginal discharge.  Have an unusual, low pain in your abdomen. Get help right away if you:  Cannot pass urine. Summary  Pelvic organ prolapse is the stretching, bulging, or dropping of pelvic organs into an abnormal position. It happens when the muscles and tissues that surround and support pelvic structures become weak or stretched.  When organs other than the vagina are involved, they often bulge into the vagina or protrude from it, depending on how severe the prolapse is.  In most cases, this condition needs to be treated only if it produces symptoms. Treatment may include lifestyle changes, estrogen, Kegel exercises, pessary insertion, or surgery.  Avoid heavy lifting and straining with exercise and work. Do not hold your breath when you perform mild to moderate lifting and exercise activities. Limit your activities as directed by your health care provider. This information is not intended to  replace advice given to you by your health care provider. Make sure you discuss any questions you have with your health care provider. Document Revised: 01/21/2020 Document Reviewed: 01/21/2020 Elsevier Patient Education  [place].

## 2020-08-22 NOTE — Progress Notes (Signed)
Gynecology Pelvic Pain Evaluation   Chief Complaint  Patient presents with  . Vaginal Exam    Possible prolapsed bladder, pelvic pain mostly on right side   History of Present Illness:   Patient is a 60 y.o. G1P1001 who LMP was No LMP recorded. Patient is postmenopausal., presents today for a problem visit.  She complains of pain.   Her pain is localized to the RLQ and also a pulling sensation in several areas of the abdomen (diaphragm, umbilicus) and a vaginal pressure, described as intermittent, burning and aching, began several weeks ago and its severity is described as moderate. The pain radiates to the  Downward as well into her diaphragm area. She has these associated symptoms which include abdominal pain. Denies urinary freq, urge, nocturia, incontinence.  No vag bleeding. Patient has these modifiers which include nothing that make it better and unable to associate with any factor that make it worse.  Context includes: spontaneous.  Prior NSVD 40+ years ago, later BTL.  No other abd surgeries.  Stopped having periods age 5, but was never dx w PCOS or other etiology.  Menopause sx's age 49.    PMHx: She  has a past medical history of Anxiety, Anxiety, Asthma, Diabetes mellitus without complication ([place]), and Stroke ([place]). Also,  has a past surgical history that includes Knee surgery., family history is not on file.,  reports that she has been smoking cigarettes. She has never used smokeless tobacco. She reports that she does not drink alcohol and does not use drugs.  She has a current medication list which includes the following prescription(s): agamatrix ultra-thin lancets, albuterol, aspirin ec, fifty50 glucose meter 2.0, budesonide-formoterol, clonazepam, cyclobenzaprine, gabapentin, precision qid test, hydroxyzine, meloxicam, perphenazine, simvastatin, tizanidine, tramadol, trazodone, metformin, and triamcinolone. Also, has No Known Allergies.  Review of Systems  Constitutional:  Negative for chills, fever and malaise/fatigue.  HENT: Negative for congestion, sinus pain and sore throat.   Eyes: Negative for blurred vision and pain.  Respiratory: Negative for cough and wheezing.   Cardiovascular: Negative for chest pain and leg swelling.  Gastrointestinal: Negative for abdominal pain, constipation, diarrhea, heartburn, nausea and vomiting.  Genitourinary: Negative for dysuria, frequency, hematuria and urgency.  Musculoskeletal: Negative for back pain, joint pain, myalgias and neck pain.  Skin: Negative for itching and rash.  Neurological: Negative for dizziness, tremors and weakness.  Endo/Heme/Allergies: Does not bruise/bleed easily.  Psychiatric/Behavioral: Negative for depression. The patient is not nervous/anxious and does not have insomnia.    Objective: BP 108/60   Ht 5\' 7"  (1.702 m)   Wt 212 lb (96.2 kg)   BMI 33.20 kg/m  Physical Exam Constitutional:      General: She is not in acute distress.    Appearance: She is well-developed and well-nourished.  Genitourinary:     Bladder, vagina, rectum and urethral meatus normal.     There is no rash or lesion on the right labia.     There is no rash or lesion on the left labia.    No lesions in the vagina.     Right Labia: No tenderness.    Left Labia: No tenderness.    No vaginal bleeding.      Right Adnexa: tender.    Right Adnexa: no mass present.    Left Adnexa: tender.    Left Adnexa: no mass present.    No cervical motion tenderness, friability, lesion or polyp.     Uterus is tender and mobile.  Uterus is not enlarged.     No uterine mass detected.    Uterus exam comments: Small, tender Prolapse noted Gr 2.     Uterus is midaxial and retroverted.     Bladder exam comments: Gr 1 Cystocele.     Pelvic exam was performed with patient supine.  Breasts:     Right: No mass, skin change or tenderness.     Left: No mass, skin change or tenderness.    HENT:     Head: Normocephalic and  atraumatic. No laceration.     Right Ear: Hearing normal.     Left Ear: Hearing normal.     Nose: No epistaxis or foreign body.     Mouth/Throat:     Mouth: Oropharynx is clear and moist and mucous membranes are normal.     Pharynx: Uvula midline.  Eyes:     Pupils: Pupils are equal, round, and reactive to light.  Neck:     Thyroid: No thyromegaly.  Cardiovascular:     Rate and Rhythm: Normal rate and regular rhythm.     Heart sounds: No murmur heard. No friction rub. No gallop.   Pulmonary:     Effort: Pulmonary effort is normal. No respiratory distress.     Breath sounds: Normal breath sounds. No wheezing.  Abdominal:     General: Bowel sounds are normal. There is no distension.     Palpations: Abdomen is soft.     Tenderness: There is no abdominal tenderness. There is no rebound.  Musculoskeletal:        General: Normal range of motion.     Cervical back: Normal range of motion and neck supple.  Neurological:     Mental Status: She is alert and oriented to person, place, and time.     Cranial Nerves: No cranial nerve deficit.  Skin:    General: Skin is warm and dry.  Psychiatric:        Mood and Affect: Mood and affect normal.        Judgment: Judgment normal.  Vitals reviewed.   Female chaperone present for pelvic portion of the physical exam  UA= KETONE and PROTEIN  Assessment: 60 y.o. G1P1001 with new onset concerns of pain, prolapse  1. RLQ abdominal pain - related to prolapse, perhaps other etiology - US PELVIC COMPLETE WITH TRANSVAGINAL; Future To assess for cysts, fibroids or anatomic etiology  2. Uterine prolapse - Options for pessary, surgery discussed, info gv - US PELVIC COMPLETE WITH TRANSVAGINAL; Future  3. Screening for cervical cancer - Cytology - PAP  Options for management discussed, and to assess for pain as short or long term related to POP.  TLH BSO discussed as option if hysterectomy is a chosen therapeutic intervention. (based on exam  today not best candidate for [facility]).  Barnett Applebaum, MD, [facility], [facility] 08/22/2020  9:10 AM

## 2020-08-27 LAB — CYTOLOGY - PAP
Chlamydia: NEGATIVE
Comment: NEGATIVE
Comment: NORMAL
Diagnosis: NEGATIVE
Neisseria Gonorrhea: NEGATIVE

## 2020-09-05 ENCOUNTER — Other Ambulatory Visit: Payer: Self-pay | Admitting: Obstetrics & Gynecology

## 2020-09-05 ENCOUNTER — Other Ambulatory Visit: Payer: Self-pay

## 2020-09-05 ENCOUNTER — Ambulatory Visit (INDEPENDENT_AMBULATORY_CARE_PROVIDER_SITE_OTHER): Payer: Medicare HMO

## 2020-09-05 ENCOUNTER — Ambulatory Visit (INDEPENDENT_AMBULATORY_CARE_PROVIDER_SITE_OTHER): Payer: Medicare HMO | Admitting: Obstetrics & Gynecology

## 2020-09-05 ENCOUNTER — Encounter: Payer: Self-pay | Admitting: Obstetrics & Gynecology

## 2020-09-05 VITALS — BP 120/70 | Ht 67.0 in | Wt 209.0 lb

## 2020-09-05 DIAGNOSIS — N814 Uterovaginal prolapse, unspecified: Secondary | ICD-10-CM | POA: Diagnosis not present

## 2020-09-05 DIAGNOSIS — R1031 Right lower quadrant pain: Secondary | ICD-10-CM

## 2020-09-05 NOTE — Progress Notes (Signed)
HPI: Pt has pain and sx's of uterine prolapse.  Desires something done about it.  She is diabled but active (due to stroke/TIA).    Her pain is localized to the RLQ and also a pulling sensation in several areas of the abdomen (diaphragm, umbilicus) and a vaginal pressure, described as intermittent, burning and aching, began several weeks ago and its severity is described as moderate. The pain radiates to the  Downward as well into her diaphragm area. She has these associated symptoms which include abdominal pain. Denies urinary freq, urge, nocturia, incontinence.  No vag bleeding. Patient has these modifiers which include nothing that make it better and unable to associate with any factor that make it worse.  Context includes: spontaneous.  Prior NSVD 40+ years ago, later BTL.  No other abd surgeries.  Stopped having periods age 35, but was never dx w PCOS or other etiology.  Menopause sx's age 70.    Ultrasound demonstrates no masses seen These findings are Pelvis normal  PMHx: She  has a past medical history of Anxiety, Anxiety, Asthma, Diabetes mellitus without complication ([place]), and Stroke ([place]). Also,  has a past surgical history that includes Knee surgery., family history is not on file.,  reports that she has been smoking cigarettes. She has never used smokeless tobacco. She reports that she does not drink alcohol and does not use drugs.  She has a current medication list which includes the following prescription(s): agamatrix ultra-thin lancets, albuterol, aspirin ec, fifty50 glucose meter 2.0, budesonide-formoterol, clonazepam, cyclobenzaprine, gabapentin, precision qid test, hydroxyzine, meloxicam, metformin, perphenazine, simvastatin, tizanidine, tramadol, trazodone, and triamcinolone. Also, has No Known Allergies.  ROS  Objective: BP 120/70   Ht 5\' 7"  (1.702 m)   Wt 209 lb (94.8 kg)   BMI 32.73 kg/m   Physical examination Constitutional NAD, Conversant  Skin No rashes,  lesions or ulceration.   Extremities: Moves all appropriately.  Normal ROM for age. No lymphadenopathy.  Neuro: Grossly intact  Psych: Oriented to PPT.  Normal mood. Normal affect.   US PELVIS TRANSVAGINAL NON-OB (TV ONLY)  Result Date: 09/05/2020 Patient Name: Kathleen Gilmore DOB: 1961-05-08 MRN: [id number] ULTRASOUND REPORT Location: [facility] Date of Service: 09/05/2020 Indications:Pelvic Pain Findings: The uterus is anteverted and measures 6.1 x 3.4 x 2.7 cm. Echo texture is homogenous without evidence of focal masses. The Endometrium measures 7.2 mm. Right Ovary measures 2.5 x 1.6 x 1.2 cm. It is normal in appearance. Left Ovary measures 3.1 x 1.0 x 1.1 cm. It is normal in appearance. Survey of the adnexa demonstrates no adnexal masses. There is no free fluid in the cul de sac. Impression: 1. The endometrium is thick. 2. The ovaries appear normal. Recommendations: 1.Clinical correlation with the patient's History and Physical Exam. Gweneth Dimitri, RT Review of ULTRASOUND.    I have personally reviewed images and report of recent ultrasound done at [facility].    Plan of management to be discussed with patient. Barnett Applebaum, MD, [facility], [facility] 09/05/2020  1:51 PM   Assessment:  1.Uterine prolapse 2.RLQ abdominal pain  Options discussed, prefers surgery to pessary or expectant management options.  Plan TLH BSO.  Pros and cons of surgery discussed, and recovery.  Plan pre-op clearance from medicine, as she has had prior stroke and is currently discabled, also has diabetes.  She reports she is not on blood thinner therapy.  Info provided  A total of 20 minutes were spent face-to-face with the patient as  well as preparation, review, communication, and documentation during this encounter.   Barnett Applebaum, MD, [facility], [facility] 09/05/2020  2:08 PM

## 2020-09-15 ENCOUNTER — Telehealth: Payer: Self-pay

## 2020-09-15 NOTE — Telephone Encounter (Signed)
Pt calling; wants to talk to [facility].  [phone]  Pt doesn't want to have surgery nor pessary.  Pt aware Knowles in office tomorrow.

## 2020-09-16 NOTE — Telephone Encounter (Signed)
Spoke with pt she didn't have any questions for Dr Kenton Kingfisher, she just wanted to let him know she was not doing the surgery.

## 2020-09-18 ENCOUNTER — Telehealth: Payer: Self-pay

## 2020-09-18 NOTE — Telephone Encounter (Signed)
Called pt to schedule surgery - v/m was full, unable to leave msg

## 2020-09-18 NOTE — Telephone Encounter (Signed)
I see in pt chart that she called and spoke with Kathleen Gilmore saying that she no longer wants to have surgery.   Would you like to withdraw your surgery request? Plz adv.

## 2020-09-18 NOTE — Telephone Encounter (Signed)
-----   Message from Gae Dry, MD sent at 09/05/2020  2:07 PM EST ----- Regarding: Surgery again Surgery Booking Request Patient Full Name:  Kathleen Gilmore  MRN: [id number]  DOB: 17-Aug-1960  Surgeon: Hoyt Koch, MD  Requested Surgery Date and Time: MARCH 2022 Primary Diagnosis AND Code:     1. Uterine prolapse  N81.4  2. RLQ abdominal pain  R10.31  Secondary Diagnosis and Code:  Surgical Procedure: TLH/BSO RNFA Requested?: No L&D Notification: No Admission Status: same day surgery Length of Surgery: 75 min Special Case Needs: No H&P: Yes Phone Interview???:  Yes Interpreter: No Medical Clearance:  Yes - PCP Dr ADAM Verner Chol Special Scheduling Instructions: No Any known health/anesthesia issues, diabetes, sleep apnea, latex allergy, defibrillator/pacemaker?: Yes - DIABETES, DISABLED due to TIA/STROKE Acuity: P3   (P1 highest, P2 delay may cause harm, P3 low, elective gyn, P4 lowest)

## 2020-09-18 NOTE — Telephone Encounter (Signed)
-----   Message from Gae Dry, MD sent at 09/05/2020  2:07 PM EST ----- Regarding: Surgery again Surgery Booking Request Patient Full Name:  Kathleen Gilmore  MRN: [id number]  DOB: Aug 29, 1960  Surgeon: Hoyt Koch, MD  Requested Surgery Date and Time: MARCH 2022 Primary Diagnosis AND Code:     1. Uterine prolapse  N81.4  2. RLQ abdominal pain  R10.31  Secondary Diagnosis and Code:  Surgical Procedure: TLH/BSO RNFA Requested?: No L&D Notification: No Admission Status: same day surgery Length of Surgery: 75 min Special Case Needs: No H&P: Yes Phone Interview???:  Yes Interpreter: No Medical Clearance:  Yes - PCP Dr ADAM Verner Chol Special Scheduling Instructions: No Any known health/anesthesia issues, diabetes, sleep apnea, latex allergy, defibrillator/pacemaker?: Yes - DIABETES, DISABLED due to TIA/STROKE Acuity: P3   (P1 highest, P2 delay may cause harm, P3 low, elective gyn, P4 lowest)

## 2020-09-24 NOTE — Telephone Encounter (Signed)
Pt called to advise that she no longer wishes to have surgery.

## 2020-12-05 ENCOUNTER — Emergency Department: Payer: Medicare HMO

## 2020-12-05 ENCOUNTER — Emergency Department
Admission: EM | Admit: 2020-12-05 | Discharge: 2020-12-05 | Disposition: A | Discharge status: Home / Self Care | Payer: Medicare HMO | Attending: Emergency Medicine | Admitting: Emergency Medicine

## 2020-12-05 ENCOUNTER — Other Ambulatory Visit: Payer: Self-pay

## 2020-12-05 DIAGNOSIS — M25561 Pain in right knee: Secondary | ICD-10-CM | POA: Diagnosis not present

## 2020-12-05 DIAGNOSIS — Z7951 Long term (current) use of inhaled steroids: Secondary | ICD-10-CM | POA: Diagnosis not present

## 2020-12-05 DIAGNOSIS — J45909 Unspecified asthma, uncomplicated: Secondary | ICD-10-CM | POA: Diagnosis not present

## 2020-12-05 DIAGNOSIS — Z7982 Long term (current) use of aspirin: Secondary | ICD-10-CM | POA: Diagnosis not present

## 2020-12-05 DIAGNOSIS — G8929 Other chronic pain: Secondary | ICD-10-CM | POA: Diagnosis not present

## 2020-12-05 DIAGNOSIS — F17210 Nicotine dependence, cigarettes, uncomplicated: Secondary | ICD-10-CM | POA: Diagnosis not present

## 2020-12-05 DIAGNOSIS — E119 Type 2 diabetes mellitus without complications: Secondary | ICD-10-CM | POA: Diagnosis not present

## 2020-12-05 DIAGNOSIS — Z7984 Long term (current) use of oral hypoglycemic drugs: Secondary | ICD-10-CM | POA: Diagnosis not present

## 2020-12-05 LAB — CBG MONITORING, ED: Glucose-Capillary: 202 mg/dL — ABNORMAL HIGH (ref 70–99)

## 2020-12-05 NOTE — ED Triage Notes (Signed)
Pt c/o right knee pain with swelling since yesterday, denies injury

## 2020-12-05 NOTE — Discharge Instructions (Signed)
Follow-up with Dr. Verner Chol at [facility] if any continued problems.  Wear the knee immobilizer for support and protection.  Continue with your gabapentin and tramadol that has already been prescribed by Dr. Verner Chol for you.  You may also use ice and elevate your knee over the weekend if needed for additional pain relief.

## 2020-12-05 NOTE — ED Notes (Signed)
See triage note  Presents with pain to right knee  States she did not fall  States feels like her knee shifted while she was sitting  Having pain anterior and posterior

## 2020-12-05 NOTE — ED Provider Notes (Signed)
[facility] Emergency Department Provider Note  ____________________________________________   Event Date/Time   First MD Initiated Contact with Patient 12/05/20 1258     (approximate)  I have reviewed the triage vital signs and the nursing notes.   HISTORY  Chief Complaint Knee Pain   HPI Kathleen Gilmore is a 60 y.o. female presents to the ED with complaint of right knee pain.  Patient states that there has been no injury and that occasionally her knee will hurt due to arthritis.  She states that [facility]. Verner Chol at [facility] injected her knee when she has pain.  She currently is taking gabapentin and tramadol which is routinely prescribed by her PCP.  Patient wears a knee brace on her left knee but has taken it off and is using it on her right knee currently because of pain.  Only she rates her pain as a 9 out of 10.       Past Medical History:  Diagnosis Date  . Anxiety   . Anxiety   . Asthma   . Diabetes mellitus without complication ([place])   . Stroke [facility])     Patient Active Problem List   Diagnosis Date Noted  . RLQ abdominal pain 08/22/2020  . Uterine prolapse 08/22/2020    Past Surgical History:  Procedure Laterality Date  . KNEE SURGERY      Prior to Admission medications   Medication Sig Start Date End Date Taking? Authorizing Provider  AgaMatrix Ultra-Thin Lancets MISC Test three times a week.  E11.9 06/20/20   [provider]  albuterol (PROVENTIL HFA;VENTOLIN HFA) 108 (90 BASE) MCG/ACT inhaler Inhale 2 puffs into the lungs 2 (two) times daily as needed for wheezing.    [provider]  aspirin EC 81 MG tablet Take 81 mg by mouth daily.    [provider]  Blood Glucose Monitoring Suppl (FIFTY50 GLUCOSE METER 2.0) w/Device KIT Use as instructed.  One touch ultra.  E11.9 06/20/20 06/20/21  [provider]  budesonide-formoterol (SYMBICORT) 160-4.5 MCG/ACT inhaler Inhale 2 puffs into the lungs 2 (two) times  daily.    [provider]  clonazePAM (KLONOPIN) 0.5 MG tablet Take 0.5 mg by mouth 2 (two) times daily.    [provider]  cyclobenzaprine (FLEXERIL) 10 MG tablet Take 1 tablet (10 mg total) by mouth 3 (three) times daily as needed. 12/05/19   Fisher, Linden Dolin, PA-C  gabapentin (NEURONTIN) 300 MG capsule 1 po bid rn pain 05/20/20   [provider]  glucose blood (PRECISION QID TEST) test strip Test three times a week. 06/20/20   [provider]  hydrOXYzine (ATARAX/VISTARIL) 50 MG tablet Take 100 mg by mouth at bedtime as needed. 03/31/20   [provider]  metFORMIN (GLUCOPHAGE) 500 MG tablet Take 500 mg by mouth 2 (two) times daily with a meal.    [provider]  perphenazine (TRILAFON) 8 MG tablet TAKE 1 TABLET BY MOUTH TWICE DAILY AND 1/2 TABLET EVERY NIGHT AT BEDTIME 05/20/20   [provider]  simvastatin (ZOCOR) 40 MG tablet Take 20 mg by mouth daily.    [provider]  tiZANidine (ZANAFLEX) 2 MG tablet Take 1 tablet by mouth every 6 (six) hours as needed. 07/28/20   [provider]  traMADol (ULTRAM) 50 MG tablet Take 1 tablet (50 mg total) by mouth every 6 (six) hours as needed. 12/05/19   Caryn Section Linden Dolin, PA-C  traZODone (DESYREL) 100 MG tablet Take by mouth. 01/29/20  04/08/21  [provider]  triamcinolone (KENALOG) 0.1 % Apply topically. 07/28/20 07/28/21  [provider]    Allergies Patient has no known allergies.  No family history on file.  Social History Social History   Tobacco Use  . Smoking status: Current Every Day Smoker    Types: Cigarettes  . Smokeless tobacco: Never Used  Substance Use Topics  . Alcohol use: No  . Drug use: No    Review of Systems Constitutional: No fever/chills Eyes: No visual changes. Cardiovascular: Denies chest pain. Respiratory: Denies shortness of breath. Gastrointestinal: No abdominal pain.  No nausea, no vomiting. Musculoskeletal:  Chronic right knee pain. Skin: Negative for rash. Neurological: Negative for headaches, focal weakness or numbness. ____________________________________________   PHYSICAL EXAM:  VITAL SIGNS: ED Triage Vitals  Enc Vitals Group     BP 12/05/20 1255 131/65     Pulse Rate 12/05/20 1255 78     Resp 12/05/20 1255 16     Temp 12/05/20 1255 98.2 F (36.8 C)     Temp Source 12/05/20 1255 Oral     SpO2 12/05/20 1255 97 %     Weight 12/05/20 1256 195 lb (88.5 kg)     Height 12/05/20 1256 _0  (1.702 m)     Head Circumference --      Peak Flow --      Pain Score 12/05/20 1256 9     Pain Loc --      Pain Edu? --      Excl. in [place]? --     Constitutional: Alert and oriented. Well appearing and in no acute distress. Eyes: Conjunctivae are normal.  Head: Atraumatic. Neck: No stridor.   Cardiovascular: Normal rate, regular rhythm. Grossly normal heart sounds.  Good peripheral circulation. Respiratory: Normal respiratory effort.  No retractions. Lungs CTAB. Gastrointestinal: Soft and nontender. No distention.  Musculoskeletal: Examination of the right knee there is no gross deformity however there is degenerative changes obvious on  visual exam.  No effusion present.  No warmth or redness.  There is generalized tenderness on palpation and with passive range of motion. Neurologic:  Normal speech and language. No gross focal neurologic deficits are appreciated.  Skin:  Skin is warm, dry and intact.  No abrasions or discoloration. Psychiatric: Mood and affect are normal. Speech and behavior are normal.  ____________________________________________   LABS (all labs ordered are listed, but only abnormal results are displayed)  Labs Reviewed  CBG MONITORING, ED - Abnormal; Notable for the following components:      Result Value   Glucose-Capillary 202 (*)    All other components within normal limits   ____________________________________________  RADIOLOGY I, Johnn Hai,  personally viewed and evaluated these images (plain radiographs) as part of my medical decision making, as well as reviewing the written report by the radiologist.  Official radiology report(s): DG Knee Complete 4 Views Right  Result Date: 12/05/2020 CLINICAL DATA:  Right knee pain.  No known injury. EXAM: RIGHT KNEE - COMPLETE 4+ VIEW COMPARISON:  Plain films right knee 10/23/2017. FINDINGS: No acute bony or joint abnormality is identified. Joint spaces are preserved. Cluster of small subcutaneous calcifications in the anterior, medial soft tissues is unchanged and may be due to some prior inflammatory process. Joint spaces are preserved. IMPRESSION: Negative exam. Electronically Signed   By: Inge Rise M.D.   On: 12/05/2020 14:03    ____________________________________________   PROCEDURES  Procedure(s) performed (including Critical Care):  Procedures   ____________________________________________  INITIAL IMPRESSION / ASSESSMENT AND PLAN / ED COURSE  As part of my medical decision making, I reviewed the following data within the electronic MEDICAL RECORD NUMBER Notes from prior ED visits and [place] Controlled Substance Database  60 year old female presents to the ED with complaint of right knee pain for which she has a history of chronic pain.  She is followed by her PCP at [facility] and occasionally gets injections in her knee.  She denies any recent injury.  X-rays were negative for any acute bony findings.  Patient will continue taking the gabapentin and tramadol that was prescribed by her PCP.  A knee immobilizer was applied for added support and protection.  Patient is to call her PCP for follow-up appointment next week.  Also today her nonfasting glucose was 202.  ____________________________________________   FINAL CLINICAL IMPRESSION(S) / ED DIAGNOSES  Final diagnoses:  Chronic pain of right knee     ED Discharge Orders    None      *Please note:  Sung Renton was  evaluated in Emergency Department on 12/05/2020 for the symptoms described in the history of present illness. She was evaluated in the context of the global COVID-19 pandemic, which necessitated consideration that the patient might be at risk for infection with the SARS-CoV-2 virus that causes COVID-19. Institutional protocols and algorithms that pertain to the evaluation of patients at risk for COVID-19 are in a state of rapid change based on information released by regulatory bodies including the CDC and federal and state organizations. These policies and algorithms were followed during the patient's care in the ED.  Some ED evaluations and interventions may be delayed as a result of limited staffing during and the pandemic.*   Note:  This document was prepared using Dragon voice recognition software and may include unintentional dictation errors.    Johnn Hai, PA-C 12/05/20 1550    Blake Divine, MD 12/05/20 1940

## 2020-12-05 NOTE — ED Notes (Signed)
Ice applied to right knee.  

## 2020-12-19 ENCOUNTER — Inpatient Hospital Stay
Admission: EM | Admit: 2020-12-19 | Discharge: 2020-12-22 | DRG: 690 | Disposition: A | Discharge status: Home / Self Care | Payer: Medicare HMO | Attending: Internal Medicine | Admitting: Internal Medicine

## 2020-12-19 ENCOUNTER — Encounter: Payer: Self-pay | Admitting: Emergency Medicine

## 2020-12-19 ENCOUNTER — Emergency Department: Payer: Medicare HMO

## 2020-12-19 ENCOUNTER — Other Ambulatory Visit: Payer: Self-pay

## 2020-12-19 DIAGNOSIS — B9620 Unspecified Escherichia coli [E. coli] as the cause of diseases classified elsewhere: Secondary | ICD-10-CM | POA: Diagnosis present

## 2020-12-19 DIAGNOSIS — Z20822 Contact with and (suspected) exposure to covid-19: Secondary | ICD-10-CM | POA: Diagnosis present

## 2020-12-19 DIAGNOSIS — N390 Urinary tract infection, site not specified: Secondary | ICD-10-CM | POA: Diagnosis present

## 2020-12-19 DIAGNOSIS — J449 Chronic obstructive pulmonary disease, unspecified: Secondary | ICD-10-CM | POA: Diagnosis present

## 2020-12-19 DIAGNOSIS — I69359 Hemiplegia and hemiparesis following cerebral infarction affecting unspecified side: Secondary | ICD-10-CM

## 2020-12-19 DIAGNOSIS — F17200 Nicotine dependence, unspecified, uncomplicated: Secondary | ICD-10-CM | POA: Diagnosis present

## 2020-12-19 DIAGNOSIS — I69354 Hemiplegia and hemiparesis following cerebral infarction affecting left non-dominant side: Secondary | ICD-10-CM | POA: Diagnosis not present

## 2020-12-19 DIAGNOSIS — F32A Depression, unspecified: Secondary | ICD-10-CM | POA: Diagnosis present

## 2020-12-19 DIAGNOSIS — E1165 Type 2 diabetes mellitus with hyperglycemia: Secondary | ICD-10-CM | POA: Diagnosis present

## 2020-12-19 DIAGNOSIS — Z79899 Other long term (current) drug therapy: Secondary | ICD-10-CM | POA: Diagnosis not present

## 2020-12-19 DIAGNOSIS — Z7951 Long term (current) use of inhaled steroids: Secondary | ICD-10-CM | POA: Diagnosis not present

## 2020-12-19 DIAGNOSIS — F17210 Nicotine dependence, cigarettes, uncomplicated: Secondary | ICD-10-CM | POA: Diagnosis present

## 2020-12-19 DIAGNOSIS — R1084 Generalized abdominal pain: Secondary | ICD-10-CM | POA: Diagnosis present

## 2020-12-19 DIAGNOSIS — Z7984 Long term (current) use of oral hypoglycemic drugs: Secondary | ICD-10-CM

## 2020-12-19 DIAGNOSIS — R112 Nausea with vomiting, unspecified: Secondary | ICD-10-CM

## 2020-12-19 DIAGNOSIS — Z7982 Long term (current) use of aspirin: Secondary | ICD-10-CM

## 2020-12-19 DIAGNOSIS — N10 Acute tubulo-interstitial nephritis: Secondary | ICD-10-CM | POA: Insufficient documentation

## 2020-12-19 DIAGNOSIS — R7881 Bacteremia: Secondary | ICD-10-CM | POA: Diagnosis present

## 2020-12-19 DIAGNOSIS — F419 Anxiety disorder, unspecified: Secondary | ICD-10-CM | POA: Diagnosis present

## 2020-12-19 DIAGNOSIS — Z833 Family history of diabetes mellitus: Secondary | ICD-10-CM | POA: Diagnosis not present

## 2020-12-19 LAB — COMPREHENSIVE METABOLIC PANEL
ALT: 27 U/L (ref 0–44)
AST: 21 U/L (ref 15–41)
Albumin: 3.4 g/dL — ABNORMAL LOW (ref 3.5–5.0)
Alkaline Phosphatase: 98 U/L (ref 38–126)
Anion gap: 13 (ref 5–15)
BUN: 19 mg/dL (ref 6–20)
CO2: 21 mmol/L — ABNORMAL LOW (ref 22–32)
Calcium: 9.2 mg/dL (ref 8.9–10.3)
Chloride: 97 mmol/L — ABNORMAL LOW (ref 98–111)
Creatinine, Ser: 1.14 mg/dL — ABNORMAL HIGH (ref 0.44–1.00)
GFR, Estimated: 55 mL/min — ABNORMAL LOW (ref >60)
Glucose, Bld: 434 mg/dL — ABNORMAL HIGH (ref 70–99)
Potassium: 4.9 mmol/L (ref 3.5–5.1)
Sodium: 131 mmol/L — ABNORMAL LOW (ref 135–145)
Total Bilirubin: 1 mg/dL (ref 0.3–1.2)
Total Protein: 8.3 g/dL — ABNORMAL HIGH (ref 6.5–8.1)

## 2020-12-19 LAB — URINALYSIS, COMPLETE (UACMP) WITH MICROSCOPIC
Bacteria, UA: NONE SEEN
Bilirubin Urine: NEGATIVE
Glucose, UA: >=500 mg/dL — AB
Ketones, ur: 80 mg/dL — AB
Leukocytes,Ua: NEGATIVE
Nitrite: POSITIVE — AB
Protein, ur: 30 mg/dL — AB
Specific Gravity, Urine: 1.023 (ref 1.005–1.030)
pH: 5 (ref 5.0–8.0)

## 2020-12-19 LAB — CBC WITH DIFFERENTIAL/PLATELET
Abs Immature Granulocytes: 0.08 10*3/uL — ABNORMAL HIGH (ref 0.00–0.07)
Basophils Absolute: 0 10*3/uL (ref 0.0–0.1)
Basophils Relative: 0 %
Eosinophils Absolute: 0 10*3/uL (ref 0.0–0.5)
Eosinophils Relative: 0 %
HCT: 37.9 % (ref 36.0–46.0)
Hemoglobin: 13 g/dL (ref 12.0–15.0)
Immature Granulocytes: 1 %
Lymphocytes Relative: 8 %
Lymphs Abs: 1 10*3/uL (ref 0.7–4.0)
MCH: 28.3 pg (ref 26.0–34.0)
MCHC: 34.3 g/dL (ref 30.0–36.0)
MCV: 82.4 fL (ref 80.0–100.0)
Monocytes Absolute: 1 10*3/uL (ref 0.1–1.0)
Monocytes Relative: 7 %
Neutro Abs: 11.2 10*3/uL — ABNORMAL HIGH (ref 1.7–7.7)
Neutrophils Relative %: 84 %
Platelets: 210 10*3/uL (ref 150–400)
RBC: 4.6 MIL/uL (ref 3.87–5.11)
RDW: 12.6 % (ref 11.5–15.5)
WBC: 13.3 10*3/uL — ABNORMAL HIGH (ref 4.0–10.5)
nRBC: 0 % (ref 0.0–0.2)

## 2020-12-19 LAB — HEMOGLOBIN A1C
Hgb A1c MFr Bld: 11.3 % — ABNORMAL HIGH (ref 4.8–5.6)
Mean Plasma Glucose: 277.61 mg/dL

## 2020-12-19 LAB — CBG MONITORING, ED: Glucose-Capillary: 390 mg/dL — ABNORMAL HIGH (ref 70–99)

## 2020-12-19 LAB — GLUCOSE, CAPILLARY
Glucose-Capillary: 233 mg/dL — ABNORMAL HIGH (ref 70–99)
Glucose-Capillary: 253 mg/dL — ABNORMAL HIGH (ref 70–99)
Glucose-Capillary: 307 mg/dL — ABNORMAL HIGH (ref 70–99)
Glucose-Capillary: 342 mg/dL — ABNORMAL HIGH (ref 70–99)

## 2020-12-19 LAB — LIPASE, BLOOD: Lipase: 32 U/L (ref 11–51)

## 2020-12-19 LAB — HIV ANTIBODY (ROUTINE TESTING W REFLEX): HIV Screen 4th Generation wRfx: NONREACTIVE

## 2020-12-19 LAB — SARS CORONAVIRUS 2 (TAT 6-24 HRS): SARS Coronavirus 2: NEGATIVE

## 2020-12-19 MED ORDER — ENOXAPARIN SODIUM 60 MG/0.6ML IJ SOSY
0.5000 mg/kg | PREFILLED_SYRINGE | INTRAMUSCULAR | Status: DC
  Administered 2020-12-19 – 2020-12-20 (×2): 47.5 mg via SUBCUTANEOUS
  Filled 2020-12-19 (×3): qty 0.6

## 2020-12-19 MED ORDER — FENTANYL CITRATE (PF) 100 MCG/2ML IJ SOLN
50.0000 ug | Freq: Once | INTRAMUSCULAR | Status: AC
  Administered 2020-12-19: 50 ug via INTRAVENOUS
  Filled 2020-12-19: qty 2

## 2020-12-19 MED ORDER — ALBUTEROL SULFATE HFA 108 (90 BASE) MCG/ACT IN AERS
2.0000 | INHALATION_SPRAY | Freq: Two times a day (BID) | RESPIRATORY_TRACT | Status: DC | PRN
  Filled 2020-12-19: qty 6.7

## 2020-12-19 MED ORDER — BISACODYL 10 MG RE SUPP
10.0000 mg | Freq: Once | RECTAL | Status: AC
  Administered 2020-12-19: 13:00:00 10 mg via RECTAL
  Filled 2020-12-19: qty 1

## 2020-12-19 MED ORDER — ONDANSETRON HCL 4 MG/2ML IJ SOLN
4.0000 mg | Freq: Once | INTRAMUSCULAR | Status: AC
  Administered 2020-12-19: 4 mg via INTRAVENOUS
  Filled 2020-12-19: qty 2

## 2020-12-19 MED ORDER — SIMVASTATIN 20 MG PO TABS
20.0000 mg | ORAL_TABLET | Freq: Every day | ORAL | Status: DC
  Administered 2020-12-19 – 2020-12-21 (×3): 20 mg via ORAL
  Filled 2020-12-19 (×3): qty 1

## 2020-12-19 MED ORDER — SODIUM CHLORIDE 0.9 % IV SOLN
2.0000 g | Freq: Once | INTRAVENOUS | Status: AC
  Administered 2020-12-19: 2 g via INTRAVENOUS
  Filled 2020-12-19: qty 20

## 2020-12-19 MED ORDER — SODIUM CHLORIDE 0.9 % IV SOLN: INTRAVENOUS | Status: DC

## 2020-12-19 MED ORDER — CYCLOBENZAPRINE HCL 10 MG PO TABS: 10.0000 mg | ORAL_TABLET | Freq: Three times a day (TID) | ORAL | Status: DC | PRN

## 2020-12-19 MED ORDER — SODIUM CHLORIDE 0.9 % IV BOLUS
1000.0000 mL | Freq: Once | INTRAVENOUS | Status: AC
  Administered 2020-12-19: 1000 mL via INTRAVENOUS

## 2020-12-19 MED ORDER — INSULIN ASPART 100 UNIT/ML IJ SOLN
0.0000 [IU] | INTRAMUSCULAR | Status: DC
  Administered 2020-12-19: 11 [IU] via SUBCUTANEOUS
  Administered 2020-12-19: 21:00:00 5 [IU] via SUBCUTANEOUS
  Administered 2020-12-19: 13:00:00 11 [IU] via SUBCUTANEOUS
  Administered 2020-12-19: 8 [IU] via SUBCUTANEOUS
  Administered 2020-12-20: 3 [IU] via SUBCUTANEOUS
  Administered 2020-12-20: 5 [IU] via SUBCUTANEOUS
  Administered 2020-12-20 (×2): 3 [IU] via SUBCUTANEOUS
  Administered 2020-12-20: 5 [IU] via SUBCUTANEOUS
  Administered 2020-12-21 (×3): 3 [IU] via SUBCUTANEOUS
  Administered 2020-12-21: 08:00:00 2 [IU] via SUBCUTANEOUS
  Administered 2020-12-21: 3 [IU] via SUBCUTANEOUS
  Administered 2020-12-21 – 2020-12-22 (×4): 2 [IU] via SUBCUTANEOUS
  Filled 2020-12-19 (×18): qty 1

## 2020-12-19 MED ORDER — INSULIN ASPART 100 UNIT/ML IJ SOLN
10.0000 [IU] | Freq: Once | INTRAMUSCULAR | Status: AC
  Administered 2020-12-19: 10 [IU] via INTRAVENOUS
  Filled 2020-12-19: qty 1

## 2020-12-19 MED ORDER — HYDROXYZINE HCL 50 MG PO TABS
100.0000 mg | ORAL_TABLET | Freq: Every evening | ORAL | Status: DC | PRN
  Filled 2020-12-19: qty 2

## 2020-12-19 MED ORDER — PANTOPRAZOLE SODIUM 40 MG IV SOLR
40.0000 mg | INTRAVENOUS | Status: DC
  Administered 2020-12-19 – 2020-12-22 (×4): 40 mg via INTRAVENOUS
  Filled 2020-12-19 (×4): qty 40

## 2020-12-19 MED ORDER — PERPHENAZINE 4 MG PO TABS
4.0000 mg | ORAL_TABLET | Freq: Two times a day (BID) | ORAL | Status: DC
  Administered 2020-12-19 – 2020-12-22 (×7): 4 mg via ORAL
  Filled 2020-12-19 (×10): qty 1

## 2020-12-19 MED ORDER — SENNOSIDES-DOCUSATE SODIUM 8.6-50 MG PO TABS
2.0000 | ORAL_TABLET | Freq: Once | ORAL | Status: AC
  Administered 2020-12-19: 2 via ORAL
  Filled 2020-12-19: qty 2

## 2020-12-19 MED ORDER — LACTATED RINGERS IV BOLUS
1000.0000 mL | Freq: Once | INTRAVENOUS | Status: AC
  Administered 2020-12-19: 1000 mL via INTRAVENOUS

## 2020-12-19 MED ORDER — KETOROLAC TROMETHAMINE 30 MG/ML IJ SOLN
15.0000 mg | Freq: Once | INTRAMUSCULAR | Status: AC
  Administered 2020-12-19: 15 mg via INTRAVENOUS
  Filled 2020-12-19: qty 1

## 2020-12-19 MED ORDER — METOCLOPRAMIDE HCL 5 MG/ML IJ SOLN
10.0000 mg | Freq: Four times a day (QID) | INTRAMUSCULAR | Status: AC
  Administered 2020-12-19 – 2020-12-20 (×4): 10 mg via INTRAVENOUS
  Filled 2020-12-19 (×4): qty 2

## 2020-12-19 MED ORDER — GABAPENTIN 300 MG PO CAPS
300.0000 mg | ORAL_CAPSULE | Freq: Two times a day (BID) | ORAL | Status: DC | PRN
  Administered 2020-12-21 (×2): 300 mg via ORAL
  Filled 2020-12-19 (×2): qty 1

## 2020-12-19 MED ORDER — MOMETASONE FURO-FORMOTEROL FUM 200-5 MCG/ACT IN AERO
2.0000 | INHALATION_SPRAY | Freq: Two times a day (BID) | RESPIRATORY_TRACT | Status: DC
  Administered 2020-12-19 – 2020-12-22 (×7): 2 via RESPIRATORY_TRACT
  Filled 2020-12-19 (×3): qty 8.8

## 2020-12-19 MED ORDER — TRAZODONE HCL 50 MG PO TABS
100.0000 mg | ORAL_TABLET | Freq: Every day | ORAL | Status: DC
  Administered 2020-12-19 – 2020-12-21 (×3): 100 mg via ORAL
  Filled 2020-12-19 (×3): qty 2

## 2020-12-19 MED ORDER — ASPIRIN EC 81 MG PO TBEC
81.0000 mg | DELAYED_RELEASE_TABLET | Freq: Every day | ORAL | Status: DC
  Administered 2020-12-19 – 2020-12-22 (×4): 81 mg via ORAL
  Filled 2020-12-19 (×4): qty 1

## 2020-12-19 MED ORDER — CLONAZEPAM 0.5 MG PO TABS
0.5000 mg | ORAL_TABLET | Freq: Two times a day (BID) | ORAL | Status: DC
  Administered 2020-12-19 – 2020-12-22 (×7): 0.5 mg via ORAL
  Filled 2020-12-19 (×7): qty 1

## 2020-12-19 MED ORDER — SODIUM CHLORIDE 0.9 % IV SOLN
1.0000 g | INTRAVENOUS | Status: DC
  Filled 2020-12-19: qty 10

## 2020-12-19 MED ORDER — NICOTINE 14 MG/24HR TD PT24
14.0000 mg | MEDICATED_PATCH | Freq: Every day | TRANSDERMAL | Status: DC
  Administered 2020-12-19 – 2020-12-21 (×3): 14 mg via TRANSDERMAL
  Filled 2020-12-19 (×5): qty 1

## 2020-12-19 NOTE — H&P (Signed)
History and Physical    Kathleen Gilmore UMP:[id number] DOB: [birth year] DOA: 12/19/2020  PCP: Chester Holstein, MD   Patient coming from: Home  I have personally briefly reviewed patient's old medical records in [place]  Chief Complaint: Abdominal pain  HPI: Kathleen Gilmore is a 60 y.o. female with medical history significant for CVA with left-sided hemiparesis, anxiety disorder, diabetes mellitus, nicotine dependence who presents to the ER for evaluation of a 4-day history of abdominal pain mostly in the suprapubic area associated with bloating, constipation, nausea and multiple episodes of nonbloody, nonbilious emesis.  She rates her abdominal pain a 6 x 10 in intensity at its worst and it is nonradiating.  Patient states she has not had a bowel movement in 4 days which is unusual for her but has been passing gas.  She has also had shaking chills but denies having any fever. She has had frequency of urination as well as  Nocturia.  She also complains of midsternal chest pain which she describes as feeling like someone is stabbing her with a needle.  Pain is nonradiating and is not related to rest or exertion. She denies having any hematemesis, no chest pain, no shortness of breath, no dizziness, no lightheadedness, no headache, no blurred vision, no focal deficits, no cough, no palpitations, no diaphoresis. Labs show sodium 131, potassium 4.9, chloride 97, bicarb 21, glucose 134, BUN 19, creatinine 1.14, calcium 9.2, anion gap 13, alkaline phosphatase 98, albumin 3.4, lipase 32, AST 21, ALT 27, total protein 8.3, white count 13.3, hemoglobin 13.0, hematocrit 37.9, MCV 82.4, RDW 12.6, platelet count 210 CT scan of abdomen and pelvis shows no acute findings in the abdomen or pelvis. Specifically, no findings to explain the patient's history of abdominal pain with constipation. Colon is largely decompressed throughout. Intestinal malrotation again noted. Hepatic steatosis. SARS coronavirus 2  point-of-care test is pending Twelve-lead EKG reviewed by me shows sinus rhythm.  ED Course: Patient is a 60 year old African-American female who presents to the ER for evaluation of abdominal pain mostly in the suprapubic area associated with constipation, bloating, nausea and vomiting.  Patient has also had shaking chills.  She has significant pyuria, leukocytosis as well as hyperglycemia and will be admitted to the hospital for further evaluation.   Review of Systems: As per HPI otherwise all other systems reviewed and negative.    Past Medical History:  Diagnosis Date  . Anxiety   . Anxiety   . Asthma   . Diabetes mellitus without complication ([place])   . Stroke [facility])     Past Surgical History:  Procedure Laterality Date  . KNEE SURGERY       reports that she has been smoking cigarettes. She has never used smokeless tobacco. She reports that she does not drink alcohol and does not use drugs.  No Known Allergies  Family History  Problem Relation Age of Onset  . Diabetes Mother       Prior to Admission medications   Medication Sig Start Date End Date Taking? Authorizing Provider  AgaMatrix Ultra-Thin Lancets MISC Test three times a week.  E11.9 06/20/20   [provider]  albuterol (PROVENTIL HFA;VENTOLIN HFA) 108 (90 BASE) MCG/ACT inhaler Inhale 2 puffs into the lungs 2 (two) times daily as needed for wheezing.    [provider]  aspirin EC 81 MG tablet Take 81 mg by mouth daily.    [provider]  Blood Glucose Monitoring Suppl (FIFTY50 GLUCOSE METER 2.0) w/Device KIT Use  as instructed.  One touch ultra.  E11.9 06/20/20 06/20/21  [provider]  budesonide-formoterol (SYMBICORT) 160-4.5 MCG/ACT inhaler Inhale 2 puffs into the lungs 2 (two) times daily.    [provider]  clonazePAM (KLONOPIN) 0.5 MG tablet Take 0.5 mg by mouth 2 (two) times daily.    [provider]  cyclobenzaprine (FLEXERIL) 10 MG tablet Take 1  tablet (10 mg total) by mouth 3 (three) times daily as needed. 12/05/19   Fisher, Linden Dolin, PA-C  gabapentin (NEURONTIN) 300 MG capsule 1 po bid rn pain 05/20/20   [provider]  glucose blood (PRECISION QID TEST) test strip Test three times a week. 06/20/20   [provider]  hydrOXYzine (ATARAX/VISTARIL) 50 MG tablet Take 100 mg by mouth at bedtime as needed. 03/31/20   [provider]  metFORMIN (GLUCOPHAGE) 500 MG tablet Take 500 mg by mouth 2 (two) times daily with a meal.    [provider]  perphenazine (TRILAFON) 8 MG tablet TAKE 1 TABLET BY MOUTH TWICE DAILY AND 1/2 TABLET EVERY NIGHT AT BEDTIME 05/20/20   [provider]  simvastatin (ZOCOR) 40 MG tablet Take 20 mg by mouth daily.    [provider]  tiZANidine (ZANAFLEX) 2 MG tablet Take 1 tablet by mouth every 6 (six) hours as needed. 07/28/20   [provider]  traMADol (ULTRAM) 50 MG tablet Take 1 tablet (50 mg total) by mouth every 6 (six) hours as needed. 12/05/19   Caryn Section Linden Dolin, PA-C  traZODone (DESYREL) 100 MG tablet Take by mouth. 01/29/20 04/08/21  [provider]  triamcinolone (KENALOG) 0.1 % Apply topically. 07/28/20 07/28/21  [provider]    Physical Exam: Vitals:   12/19/20 0508 12/19/20 0514 12/19/20 0720  BP: (!) 142/60  (!) 139/48  Pulse: 78  (!) 56  Resp:   16  Temp: 97.8 F (36.6 C)    TempSrc: Oral    SpO2: 98%  98%  Weight:  95.3 kg   Height:  _0  (1.727 m)      Vitals:   12/19/20 0508 12/19/20 0514 12/19/20 0720  BP: (!) 142/60  (!) 139/48  Pulse: 78  (!) 56  Resp:   16  Temp: 97.8 F (36.6 C)    TempSrc: Oral    SpO2: 98%  98%  Weight:  95.3 kg   Height:  _1  (1.727 m)       Constitutional: Alert and oriented x 3 . Not in any apparent distress HEENT:      Head: Normocephalic and atraumatic.         Eyes: PERLA, EOMI, Conjunctivae are normal. Sclera is non-icteric.       Mouth/Throat: Mucous membranes  are moist.       Neck: Supple with no signs of meningismus. Cardiovascular: Regular rate and rhythm. No murmurs, gallops, or rubs. 2+ symmetrical distal pulses are present . No JVD. No LE edema Respiratory: Respiratory effort normal .Lungs sounds clear bilaterally. No wheezes, crackles, or rhonchi.  Gastrointestinal: Soft, suprapubic tenderness, and non distended with positive bowel sounds.  Genitourinary: No CVA tenderness. Musculoskeletal: Nontender with normal range of motion in all extremities. No cyanosis, or erythema of extremities. Neurologic:  Face is symmetric. Moving all extremities.  Left-sided hemiparesis Skin: Skin is warm, dry.  No rash or ulcers Psychiatric: Depressed mood, flat affect   Labs on Admission: I have personally reviewed following labs and imaging studies  CBC: Recent Labs  Lab 12/19/20 0602  WBC 13.3*  NEUTROABS 11.2*  HGB 13.0  HCT 37.9  MCV 82.4  PLT 175   Basic Metabolic Panel: Recent Labs  Lab 12/19/20 0602  NA 131*  K 4.9  CL 97*  CO2 21*  GLUCOSE 434*  BUN 19  CREATININE 1.14*  CALCIUM 9.2   GFR: Estimated Creatinine Clearance: 63.4 mL/min (A) (by C-G formula based on SCr of 1.14 mg/dL (H)). Liver Function Tests: Recent Labs  Lab 12/19/20 0602  AST 21  ALT 27  ALKPHOS 98  BILITOT 1.0  PROT 8.3*  ALBUMIN 3.4*   Recent Labs  Lab 12/19/20 0821  LIPASE 32   No results for input(s): AMMONIA in the last 168 hours. Coagulation Profile: No results for input(s): INR, PROTIME in the last 168 hours. Cardiac Enzymes: No results for input(s): CKTOTAL, CKMB, CKMBINDEX, TROPONINI in the last 168 hours. BNP (last 3 results) No results for input(s): PROBNP in the last 8760 hours. HbA1C: No results for input(s): HGBA1C in the last 72 hours. CBG: Recent Labs  Lab 12/19/20 0800  GLUCAP 390*   Lipid Profile: No results for input(s): CHOL, HDL, LDLCALC, TRIG, CHOLHDL, LDLDIRECT in the last 72 hours. Thyroid Function Tests: No  results for input(s): TSH, T4TOTAL, FREET4, T3FREE, THYROIDAB in the last 72 hours. Anemia Panel: No results for input(s): VITAMINB12, FOLATE, FERRITIN, TIBC, IRON, RETICCTPCT in the last 72 hours. Urine analysis:    Component Value Date/Time   COLORURINE YELLOW (A) 12/19/2020 0741   APPEARANCEUR CLEAR (A) 12/19/2020 0741   LABSPEC 1.023 12/19/2020 0741   PHURINE 5.0 12/19/2020 0741   GLUCOSEU >=500 (A) 12/19/2020 0741   HGBUR SMALL (A) 12/19/2020 0741   BILIRUBINUR NEGATIVE 12/19/2020 0741   KETONESUR 80 (A) 12/19/2020 0741   PROTEINUR 30 (A) 12/19/2020 0741   NITRITE POSITIVE (A) 12/19/2020 0741   LEUKOCYTESUR NEGATIVE 12/19/2020 0741    Radiological Exams on Admission: CT ABDOMEN PELVIS WO CONTRAST  Result Date: 12/19/2020 CLINICAL DATA:  Abdominal pain and constipation. EXAM: CT ABDOMEN AND PELVIS WITHOUT CONTRAST TECHNIQUE: Multidetector CT imaging of the abdomen and pelvis was performed following the standard protocol without IV contrast. COMPARISON:  04/03/2020 FINDINGS: Lower chest: Unremarkable Hepatobiliary: The liver shows diffusely decreased attenuation suggesting fat deposition, progressive in the interval. Nonvisualization of the gallbladder is compatible with decompression or surgical absence. No intrahepatic or extrahepatic biliary dilation. Pancreas: No focal mass lesion. No dilatation of the main duct. No intraparenchymal cyst. No peripancreatic edema. Spleen: No splenomegaly. No focal mass lesion. Adrenals/Urinary Tract: No adrenal nodule or mass. Kidneys unremarkable. No evidence for hydroureter. The urinary bladder appears normal for the degree of distention. Stomach/Bowel: Tiny hiatal hernia. Stomach otherwise unremarkable. Duodenum does not cross the midline and small bowel is seen clustered in the right abdomen. The appendix is normal. Colon fills the left abdomen. There is no small bowel or colonic dilatation. Vascular/Lymphatic: There is abdominal aortic  atherosclerosis without aneurysm. There is no gastrohepatic or hepatoduodenal ligament lymphadenopathy. No retroperitoneal or mesenteric lymphadenopathy. No pelvic sidewall lymphadenopathy. Reproductive: The uterus is unremarkable.  There is no adnexal mass. Other: No intraperitoneal free fluid. Musculoskeletal: No worrisome lytic or sclerotic osseous abnormality. IMPRESSION: 1. No acute findings in the abdomen or pelvis. Specifically, no findings to explain the patient's history of abdominal pain with constipation. Colon is largely decompressed throughout. 2. Intestinal malrotation again noted. 3. Hepatic steatosis. 4. Aortic Atherosclerosis (ICD10-I70.0). Electronically Signed   By: Misty Stanley M.D.   On: 12/19/2020 07:30  Assessment/Plan Principal Problem:   Acute lower UTI Active Problems:   Diabetes mellitus with hyperglycemia (HCC)   Refractory nausea and vomiting   CVA, old, hemiparesis (HCC)   Nicotine dependence   COPD (chronic obstructive pulmonary disease) (HCC)   Anxiety and depression    Urinary tract infection Patient presents for evaluation of suprapubic pain associated with chills, nausea and vomiting She has significant pyuria and prior urine culture yields E. Coli We will treat patient empirically with Rocephin 1 g IV daily until urine culture results become available Follow-up results of urine and blood culture    Diabetes mellitus with hyperglycemia Hold metformin Aggressive IV fluid resuscitation Place patient on sliding scale coverage with insulin Accu-Cheks every 4 hours    Refractory nausea and vomiting ??  Diabetic gastroparesis We will place patient on scheduled Reglan for 24 hours Keep n.p.o. for now Supportive care with IV fluid hydration, antiemetics and IV PPI    Nicotine dependence Patient smokes 1/2 pack of cigarettes daily Smoking cessation has been discussed with patient in detail We will place patient on a nicotine transdermal patch  14 mg daily     COPD Not acutely exacerbated Continue as needed bronchodilator therapy and inhaled steroids    History of CVA with left-sided weakness Continue aspirin and statins    Depression and anxiety Continue clonazepam, trazodone and perphenazine  DVT prophylaxis: Lovenox Code Status: full code Family Communication: Greater than 50% of time was spent discussing patient's condition and plan of care with her at the bedside.  All questions and concerns have been addressed.  She verbalizes understanding and agrees with the plan.  She lists her sister Waunita Schooner as her healthcare power of attorney. Disposition Plan: Back to previous home environment Consults called: none Status: At the time of admission, it appears that the appropriate admission status for this patient is inpatient. This is judged to be reasonable and necessary in order to provide the required intensity of service to ensure the patient's safety given the presenting symptoms, physical exam findings and initial radiographic and laboratory data in the context of their comorbid conditions. Patient requires inpatient status due to high intensity of service, high risk for further deterioration and high frequency of surveillance required.     Collier Bullock MD [facility]     12/19/2020, 9:52 AM

## 2020-12-19 NOTE — ED Notes (Signed)
Lab called to obtain set of BC's

## 2020-12-19 NOTE — ED Provider Notes (Signed)
[facility] Emergency Department Provider Note  ____________________________________________  Time seen: Approximately 5:45 AM  I have reviewed the triage vital signs and the nursing notes.   HISTORY  Chief Complaint Constipation   HPI Kathleen Gilmore is a 60 y.o. female with a history of anxiety, asthma, diabetes, stroke who presents for evaluation of abdominal pain.  Patient reports 4 days of bloating, cramping diffuse abdominal pain, nausea, and several episodes of nonbloody nonbilious emesis.  Has not had a bowel movement since then but is passing flatus.  She denies any prior history of abdominal surgeries of SBO.  No chest pain, no shortness of breath, no dysuria or hematuria.  She does report having chills intermittently over the last few days but denies fever.   Past Medical History:  Diagnosis Date  . Anxiety   . Anxiety   . Asthma   . Diabetes mellitus without complication ([place])   . Stroke [facility])     Patient Active Problem List   Diagnosis Date Noted  . RLQ abdominal pain 08/22/2020  . Uterine prolapse 08/22/2020    Past Surgical History:  Procedure Laterality Date  . KNEE SURGERY      Prior to Admission medications   Medication Sig Start Date End Date Taking? Authorizing Provider  AgaMatrix Ultra-Thin Lancets MISC Test three times a week.  E11.9 06/20/20   [provider]  albuterol (PROVENTIL HFA;VENTOLIN HFA) 108 (90 BASE) MCG/ACT inhaler Inhale 2 puffs into the lungs 2 (two) times daily as needed for wheezing.    [provider]  aspirin EC 81 MG tablet Take 81 mg by mouth daily.    [provider]  Blood Glucose Monitoring Suppl (FIFTY50 GLUCOSE METER 2.0) w/Device KIT Use as instructed.  One touch ultra.  E11.9 06/20/20 06/20/21  [provider]  budesonide-formoterol (SYMBICORT) 160-4.5 MCG/ACT inhaler Inhale 2 puffs into the lungs 2 (two) times daily.    [provider]  clonazePAM  (KLONOPIN) 0.5 MG tablet Take 0.5 mg by mouth 2 (two) times daily.    [provider]  cyclobenzaprine (FLEXERIL) 10 MG tablet Take 1 tablet (10 mg total) by mouth 3 (three) times daily as needed. 12/05/19   Fisher, Linden Dolin, PA-C  gabapentin (NEURONTIN) 300 MG capsule 1 po bid rn pain 05/20/20   [provider]  glucose blood (PRECISION QID TEST) test strip Test three times a week. 06/20/20   [provider]  hydrOXYzine (ATARAX/VISTARIL) 50 MG tablet Take 100 mg by mouth at bedtime as needed. 03/31/20   [provider]  metFORMIN (GLUCOPHAGE) 500 MG tablet Take 500 mg by mouth 2 (two) times daily with a meal.    [provider]  perphenazine (TRILAFON) 8 MG tablet TAKE 1 TABLET BY MOUTH TWICE DAILY AND 1/2 TABLET EVERY NIGHT AT BEDTIME 05/20/20   [provider]  simvastatin (ZOCOR) 40 MG tablet Take 20 mg by mouth daily.    [provider]  tiZANidine (ZANAFLEX) 2 MG tablet Take 1 tablet by mouth every 6 (six) hours as needed. 07/28/20   [provider]  traMADol (ULTRAM) 50 MG tablet Take 1 tablet (50 mg total) by mouth every 6 (six) hours as needed. 12/05/19   Caryn Section Linden Dolin, PA-C  traZODone (DESYREL) 100 MG tablet Take by mouth. 01/29/20 04/08/21  [provider]  triamcinolone (KENALOG) 0.1 % Apply topically. 07/28/20 07/28/21  [provider]    Allergies Patient has no known allergies.  History reviewed. No  pertinent family history.  Social History Social History   Tobacco Use  . Smoking status: Current Every Day Smoker    Types: Cigarettes  . Smokeless tobacco: Never Used  Substance Use Topics  . Alcohol use: No  . Drug use: No    Review of Systems  Constitutional: Negative for fever. + chills Eyes: Negative for visual changes. ENT: Negative for sore throat. Neck: No neck pain  Cardiovascular: Negative for chest pain. Respiratory: Negative for shortness of breath. Gastrointestinal: +  abdominal pain, vomiting and diarrhea Genitourinary: Negative for dysuria. Musculoskeletal: Negative for back pain. Skin: Negative for rash. Neurological: Negative for headaches, weakness or numbness. Psych: No SI or HI  ____________________________________________   PHYSICAL EXAM:  VITAL SIGNS: ED Triage Vitals  Enc Vitals Group     BP 12/19/20 0508 (!) 142/60     Pulse Rate 12/19/20 0508 78     Resp --      Temp 12/19/20 0508 97.8 F (36.6 C)     Temp Source 12/19/20 0508 Oral     SpO2 12/19/20 0508 98 %     Weight 12/19/20 0514 210 lb (95.3 kg)     Height 12/19/20 0514 5' 8"  (1.727 m)     Head Circumference --      Peak Flow --      Pain Score 12/19/20 0513 10     Pain Loc --      Pain Edu? --      Excl. in [place]? --     Constitutional: Alert and oriented, patient looks uncomfortable.  HEENT:      Head: Normocephalic and atraumatic.         Eyes: Conjunctivae are normal. Sclera is non-icteric.       Mouth/Throat: Mucous membranes are moist.       Neck: Supple with no signs of meningismus. Cardiovascular: Regular rate and rhythm. No murmurs, gallops, or rubs. 2+ symmetrical distal pulses are present in all extremities. No JVD. Respiratory: Normal respiratory effort. Lungs are clear to auscultation bilaterally.  Gastrointestinal: Soft, diffusely tender to palpation with no rebound or guarding.  Not distended.  Positive bowel sound . Musculoskeletal:  No edema, cyanosis, or erythema of extremities. Neurologic: Normal speech and language. Face is symmetric. Moving all extremities. No gross focal neurologic deficits are appreciated. Skin: Skin is warm, dry and intact. No rash noted. Psychiatric: Mood and affect are normal. Speech and behavior are normal.  ____________________________________________   LABS (all labs ordered are listed, but only abnormal results are displayed)  Labs Reviewed  CBC WITH DIFFERENTIAL/PLATELET - Abnormal; Notable for the following  components:      Result Value   WBC 13.3 (*)    Neutro Abs 11.2 (*)    Abs Immature Granulocytes 0.08 (*)    All other components within normal limits  COMPREHENSIVE METABOLIC PANEL - Abnormal; Notable for the following components:   Sodium 131 (*)    Chloride 97 (*)    CO2 21 (*)    Glucose, Bld 434 (*)    Creatinine, Ser 1.14 (*)    Total Protein 8.3 (*)    Albumin 3.4 (*)    GFR, Estimated 55 (*)    All other components within normal limits  URINALYSIS, COMPLETE (UACMP) WITH MICROSCOPIC   ____________________________________________  EKG  ED ECG REPORT I, Rudene Re, the attending physician, personally viewed and interpreted this ECG.  Normal sinus rhythm, rate of 64, normal intervals, normal axis, no ST elevations or depressions.  ____________________________________________  RADIOLOGY  I have personally reviewed the images performed during this visit and I agree with the Radiologist's read.   Interpretation by Radiologist:  No results found.    ____________________________________________   PROCEDURES  Procedure(s) performed:yes .1-3 Lead EKG Interpretation Performed by: Rudene Re, MD Authorized by: Rudene Re, MD     Interpretation: non-specific     ECG rate assessment: normal     Rhythm: sinus rhythm     Ectopy: none     Conduction: normal     Critical Care performed:  None ____________________________________________   INITIAL IMPRESSION / ASSESSMENT AND PLAN / ED COURSE  60 y.o. female with a history of anxiety, asthma, diabetes, stroke who presents for evaluation of abdominal pain, nausea, vomiting, and constipation for 4 days.  Patient looks uncomfortable but in no significant distress, she has normal vital signs, abdomen is nondistended with diffuse tenderness, no rebound or guarding and positive bowel sounds.  Differential diagnosis including constipation versus SBO versus ileus versus gastritis versus PUD versus  pancreatitis versus gallbladder pathology versus viral syndrome.  Will give IV fluids, IV Zofran, and IV fentanyl for symptom relief.  We will get labs and a CT.  Medical records reviewed.  Patient placed on telemetry for monitoring of cardiorespiratory status.  _________________________ 6:55 AM on 12/19/2020 -----------------------------------------  Labs showing leukocytosis and hyperglycemia with no evidence of DKA. IVF given. CT pending. Care transferred to Dr. Ellender Hose     _____________________________________________ Please note:  Patient was evaluated in Emergency Department today for the symptoms described in the history of present illness. Patient was evaluated in the context of the global COVID-19 pandemic, which necessitated consideration that the patient might be at risk for infection with the SARS-CoV-2 virus that causes COVID-19. Institutional protocols and algorithms that pertain to the evaluation of patients at risk for COVID-19 are in a state of rapid change based on information released by regulatory bodies including the CDC and federal and state organizations. These policies and algorithms were followed during the patient's care in the ED.  Some ED evaluations and interventions may be delayed as a result of limited staffing during the pandemic.   [place] Controlled Substance Database was reviewed by me. ____________________________________________   FINAL CLINICAL IMPRESSION(S) / ED DIAGNOSES   Final diagnoses:  Generalized abdominal pain  Non-intractable vomiting with nausea, unspecified vomiting type      NEW MEDICATIONS STARTED DURING THIS VISIT:  ED Discharge Orders    None       Note:  This document was prepared using Dragon voice recognition software and may include unintentional dictation errors.    Alfred Levins, [place], MD 12/19/20 2300

## 2020-12-19 NOTE — ED Provider Notes (Signed)
60 yo F here with n/v, hyperglycemia, abdominal pain. Initial CT shows no acute abnormality. Labs reviewed, show leukocytosis w left shift, hyperglycemia, but normal (though borderline) AG. UA sent shows ketones, nitrites, and pyuria. Suspect pyelo. IVF, insulin, Abx started. Pt has persistent vomiting in ED. Will admit.   Duffy Bruce, MD 12/19/20 [phone]

## 2020-12-19 NOTE — ED Triage Notes (Signed)
Pt reports that for the 4 days she has had constipation and that she has had N/V. She feels that her stomach is bloated.

## 2020-12-19 NOTE — ED Notes (Signed)
Pt threw up green and yellow vomit about 352ml

## 2020-12-19 NOTE — Progress Notes (Signed)
PHARMACIST - PHYSICIAN COMMUNICATION  CONCERNING:  Enoxaparin (Lovenox) for DVT Prophylaxis    RECOMMENDATION: Patient was prescribed enoxaparin 40mg  q24 hours for VTE prophylaxis.   Filed Weights   12/19/20 0514  Weight: 95.3 kg (210 lb)    Body mass index is 31.93 kg/m.  Estimated Creatinine Clearance: 63.4 mL/min (A) (by C-G formula based on SCr of 1.14 mg/dL (H)).   Based on [place] patient is candidate for enoxaparin 0.5mg /kg TBW SQ every 24 hours based on BMI being >30.  DESCRIPTION: Pharmacy has adjusted enoxaparin dose per [facility] policy.  Patient is now receiving enoxaparin 47.5 mg every 24 hours   Benita Gutter 12/19/2020 8:48 AM

## 2020-12-20 LAB — BASIC METABOLIC PANEL
Anion gap: 8 (ref 5–15)
BUN: 16 mg/dL (ref 6–20)
CO2: 22 mmol/L (ref 22–32)
Calcium: 8.5 mg/dL — ABNORMAL LOW (ref 8.9–10.3)
Chloride: 103 mmol/L (ref 98–111)
Creatinine, Ser: 0.85 mg/dL (ref 0.44–1.00)
GFR, Estimated: >60 mL/min (ref >60)
Glucose, Bld: 220 mg/dL — ABNORMAL HIGH (ref 70–99)
Potassium: 4 mmol/L (ref 3.5–5.1)
Sodium: 133 mmol/L — ABNORMAL LOW (ref 135–145)

## 2020-12-20 LAB — CBC
HCT: 35 % — ABNORMAL LOW (ref 36.0–46.0)
Hemoglobin: 12.1 g/dL (ref 12.0–15.0)
MCH: 28.3 pg (ref 26.0–34.0)
MCHC: 34.6 g/dL (ref 30.0–36.0)
MCV: 81.8 fL (ref 80.0–100.0)
Platelets: 235 10*3/uL (ref 150–400)
RBC: 4.28 MIL/uL (ref 3.87–5.11)
RDW: 12.7 % (ref 11.5–15.5)
WBC: 13.1 10*3/uL — ABNORMAL HIGH (ref 4.0–10.5)
nRBC: 0 % (ref 0.0–0.2)

## 2020-12-20 LAB — BLOOD CULTURE ID PANEL (REFLEXED) - BCID2

## 2020-12-20 LAB — GLUCOSE, CAPILLARY
Glucose-Capillary: 239 mg/dL — ABNORMAL HIGH (ref 70–99)
Glucose-Capillary: 243 mg/dL — ABNORMAL HIGH (ref 70–99)
Glucose-Capillary: 200 mg/dL — ABNORMAL HIGH (ref 70–99)
Glucose-Capillary: 179 mg/dL — ABNORMAL HIGH (ref 70–99)
Glucose-Capillary: 181 mg/dL — ABNORMAL HIGH (ref 70–99)

## 2020-12-20 MED ORDER — ACETAMINOPHEN 325 MG PO TABS
650.0000 mg | ORAL_TABLET | Freq: Four times a day (QID) | ORAL | Status: DC | PRN
  Administered 2020-12-20 – 2020-12-21 (×4): 650 mg via ORAL
  Filled 2020-12-20 (×4): qty 2

## 2020-12-20 MED ORDER — KETOROLAC TROMETHAMINE 15 MG/ML IJ SOLN
15.0000 mg | Freq: Four times a day (QID) | INTRAMUSCULAR | Status: DC | PRN
  Administered 2020-12-20 – 2020-12-21 (×2): 15 mg via INTRAVENOUS
  Filled 2020-12-20 (×4): qty 1

## 2020-12-20 MED ORDER — SIMETHICONE 80 MG PO CHEW
80.0000 mg | CHEWABLE_TABLET | Freq: Once | ORAL | Status: AC
  Administered 2020-12-20: 80 mg via ORAL
  Filled 2020-12-20: qty 1

## 2020-12-20 MED ORDER — INSULIN GLARGINE 100 UNIT/ML ~~LOC~~ SOLN
5.0000 [IU] | Freq: Every day | SUBCUTANEOUS | Status: DC
  Administered 2020-12-20 – 2020-12-22 (×3): 5 [IU] via SUBCUTANEOUS
  Filled 2020-12-20 (×4): qty 0.05

## 2020-12-20 MED ORDER — METOCLOPRAMIDE HCL 5 MG/ML IJ SOLN
10.0000 mg | Freq: Three times a day (TID) | INTRAMUSCULAR | Status: DC
  Administered 2020-12-20 – 2020-12-22 (×6): 10 mg via INTRAVENOUS
  Filled 2020-12-20 (×6): qty 2

## 2020-12-20 MED ORDER — SODIUM CHLORIDE 0.9 % IV SOLN
2.0000 g | INTRAVENOUS | Status: DC
  Administered 2020-12-20 – 2020-12-22 (×3): 2 g via INTRAVENOUS
  Filled 2020-12-20 (×3): qty 2

## 2020-12-20 NOTE — Progress Notes (Signed)
PHARMACY - PHYSICIAN COMMUNICATION CRITICAL VALUE ALERT - BLOOD CULTURE IDENTIFICATION (BCID)  Kathleen Gilmore is an 59 y.o. female who presented to [facility] on 12/19/2020 with a chief complaint of UTI   Assessment:  E Coli in 1 of 4 bottles , no resistance detected.  (include suspected source if known)  Name of physician (or Provider) Contacted: Prudy Feeler   Current antibiotics: Ceftriaxone 1 gm IV Q24H   Changes to prescribed antibiotics recommended:  Recommendations accepted by provider  Will increase dose to ceftriaxone 2 gm IV Q24H .   Results for orders placed or performed during the hospital encounter of 12/19/20  Blood Culture ID Panel (Reflexed) (Collected: 12/19/2020  8:23 AM)  Result Value Ref Range   Enterococcus faecalis NOT DETECTED NOT DETECTED   Enterococcus Faecium NOT DETECTED NOT DETECTED   Listeria monocytogenes NOT DETECTED NOT DETECTED   Staphylococcus species NOT DETECTED NOT DETECTED   Staphylococcus aureus (BCID) NOT DETECTED NOT DETECTED   Staphylococcus epidermidis NOT DETECTED NOT DETECTED   Staphylococcus lugdunensis NOT DETECTED NOT DETECTED   Streptococcus species NOT DETECTED NOT DETECTED   Streptococcus agalactiae NOT DETECTED NOT DETECTED   Streptococcus pneumoniae NOT DETECTED NOT DETECTED   Streptococcus pyogenes NOT DETECTED NOT DETECTED   A.calcoaceticus-baumannii NOT DETECTED NOT DETECTED   Bacteroides fragilis NOT DETECTED NOT DETECTED   Enterobacterales DETECTED (A) NOT DETECTED   Enterobacter cloacae complex NOT DETECTED NOT DETECTED   Escherichia coli DETECTED (A) NOT DETECTED   Klebsiella aerogenes NOT DETECTED NOT DETECTED   Klebsiella oxytoca NOT DETECTED NOT DETECTED   Klebsiella pneumoniae NOT DETECTED NOT DETECTED   Proteus species NOT DETECTED NOT DETECTED   Salmonella species NOT DETECTED NOT DETECTED   Serratia marcescens NOT DETECTED NOT DETECTED   Haemophilus influenzae NOT DETECTED NOT DETECTED   Neisseria meningitidis NOT  DETECTED NOT DETECTED   Pseudomonas aeruginosa NOT DETECTED NOT DETECTED   Stenotrophomonas maltophilia NOT DETECTED NOT DETECTED   Candida albicans NOT DETECTED NOT DETECTED   Candida auris NOT DETECTED NOT DETECTED   Candida glabrata NOT DETECTED NOT DETECTED   Candida krusei NOT DETECTED NOT DETECTED   Candida parapsilosis NOT DETECTED NOT DETECTED   Candida tropicalis NOT DETECTED NOT DETECTED   Cryptococcus neoformans/gattii NOT DETECTED NOT DETECTED   CTX-M ESBL NOT DETECTED NOT DETECTED   Carbapenem resistance IMP NOT DETECTED NOT DETECTED   Carbapenem resistance KPC NOT DETECTED NOT DETECTED   Carbapenem resistance NDM NOT DETECTED NOT DETECTED   Carbapenem resist OXA 48 LIKE NOT DETECTED NOT DETECTED   Carbapenem resistance VIM NOT DETECTED NOT DETECTED    Kathleen Gilmore D 12/20/2020  1:24 AM

## 2020-12-20 NOTE — Progress Notes (Signed)
PROGRESS NOTE    Kathleen Gilmore  ZOX:[id number] DOB: 07/28/1961 DOA: 12/19/2020 PCP: Chester Holstein, MD    Brief Narrative:  Kathleen Gilmore is a 60 y.o. female with medical history significant for CVA with left-sided hemiparesis, anxiety disorder, diabetes mellitus, nicotine dependence who presents to the ER for evaluation of a 4-day history of abdominal pain mostly in the suprapubic area associated with bloating, constipation, nausea and multiple episodes of nonbloody, nonbilious emesis.  She rates her abdominal pain a 6 x 10 in intensity at its worst and it is nonradiating.  Patient states she has not had a bowel movement in 4 days which is unusual for her but has been passing gas.  She has also had shaking chills but denies having any fever. She has had frequency of urination as well as  Nocturia.  She also complains of midsternal chest pain which she describes as feeling like someone is stabbing her with a needle.  Pain is nonradiating and is not related to rest or exertion.  CT scan of abdomen and pelvis shows no acute findings in the abdomen or pelvis. Specifically, no findings to explain the patient's history of abdominal pain with constipation. Colon is largely decompressed throughout. Intestinal malrotation again noted. Hepatic steatosis  COVID-negative  Consultants:     Procedures:   Antimicrobials:   Rocephin   Subjective: Reports not feeling well, but unable to elaborate on this. Wants to go home, said will leave AMA but later nsg told me pt calmed down after speaking to her sister.  Objective: Vitals:   12/19/20 2038 12/19/20 2343 12/20/20 0422 12/20/20 0806  BP: (!) 141/54 (!) 120/51 132/69 (!) 135/51  Pulse: (!) 59 (!) 57 (!) 53 (!) 51  Resp: 16 18 16 16   Temp: 99.5 F (37.5 C) 99.7 F (37.6 C) 98.6 F (37 C) 97.6 F (36.4 C)  TempSrc: Oral Oral Oral Oral  SpO2: 98% 100% 94% 97%  Weight:      Height:        Intake/Output Summary (Last 24 hours) at 12/20/2020  0841 Last data filed at 12/20/2020 0414 Gross per 24 hour  Intake 1509.48 ml  Output --  Net 1509.48 ml   Filed Weights   12/19/20 0514  Weight: 95.3 kg    Examination:  General exam: Appears calm and comfortable  Respiratory system: Clear to auscultation. Respiratory effort normal. Cardiovascular system: S1 & S2 heard, RRR. No JVD, murmurs, rubs, gallops or clicks.  Gastrointestinal system: Abdomen is nondistended, soft and nontender.  Normal bowel sounds heard. Central nervous system: Alert and awake.  Grossly intac Extremities: No edema Skin: Warm dry Psychiatry: . Mood & affect appropriate in current setting.     Data Reviewed: I have personally reviewed following labs and imaging studies  CBC: Recent Labs  Lab 12/19/20 0602 12/20/20 0429  WBC 13.3* 13.1*  NEUTROABS 11.2*  --   HGB 13.0 12.1  HCT 37.9 35.0*  MCV 82.4 81.8  PLT 210 811   Basic Metabolic Panel: Recent Labs  Lab 12/19/20 0602 12/20/20 0429  NA 131* 133*  K 4.9 4.0  CL 97* 103  CO2 21* 22  GLUCOSE 434* 220*  BUN 19 16  CREATININE 1.14* 0.85  CALCIUM 9.2 8.5*   GFR: Estimated Creatinine Clearance: 85 mL/min (by C-G formula based on SCr of 0.85 mg/dL). Liver Function Tests: Recent Labs  Lab 12/19/20 0602  AST 21  ALT 27  ALKPHOS 98  BILITOT 1.0  PROT 8.3*  ALBUMIN  3.4*   Recent Labs  Lab 12/19/20 0821  LIPASE 32   No results for input(s): AMMONIA in the last 168 hours. Coagulation Profile: No results for input(s): INR, PROTIME in the last 168 hours. Cardiac Enzymes: No results for input(s): CKTOTAL, CKMB, CKMBINDEX, TROPONINI in the last 168 hours. BNP (last 3 results) No results for input(s): PROBNP in the last 8760 hours. HbA1C: Recent Labs    12/19/20 1050  HGBA1C 11.3*   CBG: Recent Labs  Lab 12/19/20 1650 12/19/20 2039 12/19/20 2344 12/20/20 0423 12/20/20 0808  GLUCAP 307* 233* 253* 239* 243*   Lipid Profile: No results for input(s): CHOL, HDL, LDLCALC,  TRIG, CHOLHDL, LDLDIRECT in the last 72 hours. Thyroid Function Tests: No results for input(s): TSH, T4TOTAL, FREET4, T3FREE, THYROIDAB in the last 72 hours. Anemia Panel: No results for input(s): VITAMINB12, FOLATE, FERRITIN, TIBC, IRON, RETICCTPCT in the last 72 hours. Sepsis Labs: No results for input(s): PROCALCITON, LATICACIDVEN in the last 168 hours.  Recent Results (from the past 240 hour(s))  Blood culture (routine x 2)     Status: None (Preliminary result)   Collection Time: 12/19/20  8:23 AM   Specimen: BLOOD  Result Value Ref Range Status   Specimen Description BLOOD BLOOD RIGHT HAND  Final   Special Requests   Final    BOTTLES DRAWN AEROBIC AND ANAEROBIC Blood Culture adequate volume   Culture  Setup Time   Final    Organism ID to follow GRAM NEGATIVE RODS AEROBIC BOTTLE ONLY CRITICAL RESULT CALLED TO, READ BACK BY AND VERIFIED WITH: JASON ROBBINS ON 12/20/20 AT 0117 QSD Performed at [facility], [address] [place], [place] [address]    Culture GRAM NEGATIVE RODS  Final   Report Status PENDING  Incomplete  Blood Culture ID Panel (Reflexed)     Status: Abnormal   Collection Time: 12/19/20  8:23 AM  Result Value Ref Range Status   Enterococcus faecalis NOT DETECTED NOT DETECTED Final   Enterococcus Faecium NOT DETECTED NOT DETECTED Final   Listeria monocytogenes NOT DETECTED NOT DETECTED Final   Staphylococcus species NOT DETECTED NOT DETECTED Final   Staphylococcus aureus (BCID) NOT DETECTED NOT DETECTED Final   Staphylococcus epidermidis NOT DETECTED NOT DETECTED Final   Staphylococcus lugdunensis NOT DETECTED NOT DETECTED Final   Streptococcus species NOT DETECTED NOT DETECTED Final   Streptococcus agalactiae NOT DETECTED NOT DETECTED Final   Streptococcus pneumoniae NOT DETECTED NOT DETECTED Final   Streptococcus pyogenes NOT DETECTED NOT DETECTED Final   A.calcoaceticus-baumannii NOT DETECTED NOT DETECTED Final   Bacteroides fragilis NOT DETECTED  NOT DETECTED Final   Enterobacterales DETECTED (A) NOT DETECTED Final    Comment: Enterobacterales represent a large order of gram negative bacteria, not a single organism. CRITICAL RESULT CALLED TO, READ BACK BY AND VERIFIED WITH: JASON ROBBINS ON 12/20/20 AT 0117 QSD    Enterobacter cloacae complex NOT DETECTED NOT DETECTED Final   Escherichia coli DETECTED (A) NOT DETECTED Final    Comment: CRITICAL RESULT CALLED TO, READ BACK BY AND VERIFIED WITH: JASON ROBBINS ON 12/20/20 AT 0117 QSD    Klebsiella aerogenes NOT DETECTED NOT DETECTED Final   Klebsiella oxytoca NOT DETECTED NOT DETECTED Final   Klebsiella pneumoniae NOT DETECTED NOT DETECTED Final   Proteus species NOT DETECTED NOT DETECTED Final   Salmonella species NOT DETECTED NOT DETECTED Final   Serratia marcescens NOT DETECTED NOT DETECTED Final   Haemophilus influenzae NOT DETECTED NOT DETECTED Final   Neisseria meningitidis NOT  DETECTED NOT DETECTED Final   Pseudomonas aeruginosa NOT DETECTED NOT DETECTED Final   Stenotrophomonas maltophilia NOT DETECTED NOT DETECTED Final   Candida albicans NOT DETECTED NOT DETECTED Final   Candida auris NOT DETECTED NOT DETECTED Final   Candida glabrata NOT DETECTED NOT DETECTED Final   Candida krusei NOT DETECTED NOT DETECTED Final   Candida parapsilosis NOT DETECTED NOT DETECTED Final   Candida tropicalis NOT DETECTED NOT DETECTED Final   Cryptococcus neoformans/gattii NOT DETECTED NOT DETECTED Final   CTX-M ESBL NOT DETECTED NOT DETECTED Final   Carbapenem resistance IMP NOT DETECTED NOT DETECTED Final   Carbapenem resistance KPC NOT DETECTED NOT DETECTED Final   Carbapenem resistance NDM NOT DETECTED NOT DETECTED Final   Carbapenem resist OXA 48 LIKE NOT DETECTED NOT DETECTED Final   Carbapenem resistance VIM NOT DETECTED NOT DETECTED Final    Comment: Performed at [facility], [address] [place], [place] [address]  Blood culture (routine x 2)     Status: None  (Preliminary result)   Collection Time: 12/19/20  8:31 AM   Specimen: BLOOD  Result Value Ref Range Status   Specimen Description BLOOD BLOOD LEFT HAND  Final   Special Requests   Final    BOTTLES DRAWN AEROBIC AND ANAEROBIC Blood Culture adequate volume   Culture  Setup Time   Final    GRAM NEGATIVE RODS AEROBIC BOTTLE ONLY CRITICAL VALUE NOTED.  VALUE IS CONSISTENT WITH PREVIOUSLY REPORTED AND CALLED VALUE. Performed at [facility], [address] [place], [place] [address]    Culture PENDING  Incomplete   Report Status PENDING  Incomplete  SARS CORONAVIRUS 2 (TAT 6-24 HRS) Nasopharyngeal Nasopharyngeal Swab     Status: None   Collection Time: 12/19/20  8:45 AM   Specimen: Nasopharyngeal Swab  Result Value Ref Range Status   SARS Coronavirus 2 NEGATIVE NEGATIVE Final    Comment: (NOTE) SARS-CoV-2 target nucleic acids are NOT DETECTED.  The SARS-CoV-2 RNA is generally detectable in upper and lower respiratory specimens during the acute phase of infection. Negative results do not preclude SARS-CoV-2 infection, do not rule out co-infections with other pathogens, and should not be used as the sole basis for treatment or other patient management decisions. Negative results must be combined with clinical observations, patient history, and epidemiological information. The expected result is Negative.  Fact Sheet for Patients: SugarRoll.be  Fact Sheet for Healthcare Providers: https://www.woods-mathews.com/  This test is not yet approved or cleared by the [place] FDA and  has been authorized for detection and/or diagnosis of SARS-CoV-2 by FDA under an Emergency Use Authorization (EUA). This EUA will remain  in effect (meaning this test can be used) for the duration of the COVID-19 declaration under Se ction 564(b)(1) of the Act, 21 U.S.C. section 360bbb-3(b)(1), unless the authorization is terminated or revoked  sooner.  Performed at [facility], [place] [address], [place], [place] [address]          Radiology Studies: CT ABDOMEN PELVIS WO CONTRAST  Result Date: 12/19/2020 CLINICAL DATA:  Abdominal pain and constipation. EXAM: CT ABDOMEN AND PELVIS WITHOUT CONTRAST TECHNIQUE: Multidetector CT imaging of the abdomen and pelvis was performed following the standard protocol without IV contrast. COMPARISON:  04/03/2020 FINDINGS: Lower chest: Unremarkable Hepatobiliary: The liver shows diffusely decreased attenuation suggesting fat deposition, progressive in the interval. Nonvisualization of the gallbladder is compatible with decompression or surgical absence. No intrahepatic or extrahepatic biliary dilation. Pancreas: No focal mass lesion. No  dilatation of the main duct. No intraparenchymal cyst. No peripancreatic edema. Spleen: No splenomegaly. No focal mass lesion. Adrenals/Urinary Tract: No adrenal nodule or mass. Kidneys unremarkable. No evidence for hydroureter. The urinary bladder appears normal for the degree of distention. Stomach/Bowel: Tiny hiatal hernia. Stomach otherwise unremarkable. Duodenum does not cross the midline and small bowel is seen clustered in the right abdomen. The appendix is normal. Colon fills the left abdomen. There is no small bowel or colonic dilatation. Vascular/Lymphatic: There is abdominal aortic atherosclerosis without aneurysm. There is no gastrohepatic or hepatoduodenal ligament lymphadenopathy. No retroperitoneal or mesenteric lymphadenopathy. No pelvic sidewall lymphadenopathy. Reproductive: The uterus is unremarkable.  There is no adnexal mass. Other: No intraperitoneal free fluid. Musculoskeletal: No worrisome lytic or sclerotic osseous abnormality. IMPRESSION: 1. No acute findings in the abdomen or pelvis. Specifically, no findings to explain the patient's history of abdominal pain with constipation. Colon is largely decompressed throughout. 2. Intestinal malrotation  again noted. 3. Hepatic steatosis. 4. Aortic Atherosclerosis (ICD10-I70.0). Electronically Signed   By: Misty Stanley M.D.   On: 12/19/2020 07:30        Scheduled Meds: . aspirin EC  81 mg Oral Daily  . clonazePAM  0.5 mg Oral BID  . enoxaparin (LOVENOX) injection  0.5 mg/kg Subcutaneous Q24H  . insulin aspart  0-15 Units Subcutaneous Q4H  . mometasone-formoterol  2 puff Inhalation BID  . nicotine  14 mg Transdermal Daily  . pantoprazole (PROTONIX) IV  40 mg Intravenous Q24H  . perphenazine  4 mg Oral BID  . simvastatin  20 mg Oral QHS  . traZODone  100 mg Oral QHS   Continuous Infusions: . sodium chloride 100 mL/hr at 12/19/20 2331  . cefTRIAXone (ROCEPHIN)  IV 2 g (12/20/20 [id number])    Assessment & Plan:   Principal Problem:   Acute lower UTI Active Problems:   Diabetes mellitus with hyperglycemia (HCC)   Refractory nausea and vomiting   CVA, old, hemiparesis ([facility] [place])   Nicotine dependence   COPD (chronic obstructive pulmonary disease) (HCC)   Anxiety and depression   Urinary tract infection Patient presents for evaluation of suprapubic pain associated with chills, nausea and vomiting 5/14-urine culture pending, however blood culture came back positive for E. coli  We will continue Rocephin until culture final results return    E.Coli Bacteremia- likely 2/2 UTI Continue Rocephin Taper medication to final culture results sensitivity    Diabetes mellitus with hyperglycemia Blood glucose levels on the elevated side Has decreased since admission Hold metformin Continue IV fluids Add Lantus 5 units daily R-ISS    Refractory nausea and vomiting ??  Diabetic gastroparesis v.s. bacteremia/UTI We will place patient on scheduled Reglan for 24 hours 5/14-had BM Advance diet  Continue ivf /supportive care    Nicotine dependence Patient smokes 1/2 pack of cigarettes daily Smoking cessation has been discussed with patient in detail We will place patient on  a nicotine transdermal patch 14 mg daily     COPD Not acutely exacerbated Continue as needed bronchodilator therapy and inhaled steroids    History of CVA with left-sided weakness Continue aspirin and statins    Depression and anxiety Continue clonazepam, trazodone and perphenazine   DVT prophylaxis: Lovenox Code Status: Full code Family Communication: Left voicemail for sister Disposition Plan:  Status is: Inpatient  Remains inpatient appropriate because:Inpatient level of care appropriate due to severity of illness   Dispo: The patient is from: Home  Anticipated d/c is to: Home              Patient currently is not medically stable to d/c.   Difficult to place patient No            LOS: 1 day   Time spent: 35 minutes with more than 50% on [place], MD [facility] Pager [phone]  If 7PM-7AM, please contact night-coverage 12/20/2020, 8:41 AM

## 2020-12-21 LAB — CBC
HCT: 34.9 % — ABNORMAL LOW (ref 36.0–46.0)
Hemoglobin: 11.7 g/dL — ABNORMAL LOW (ref 12.0–15.0)
MCH: 27.5 pg (ref 26.0–34.0)
MCHC: 33.5 g/dL (ref 30.0–36.0)
MCV: 81.9 fL (ref 80.0–100.0)
Platelets: 232 10*3/uL (ref 150–400)
RBC: 4.26 MIL/uL (ref 3.87–5.11)
RDW: 12.6 % (ref 11.5–15.5)
WBC: 9.7 10*3/uL (ref 4.0–10.5)
nRBC: 0 % (ref 0.0–0.2)

## 2020-12-21 LAB — GLUCOSE, CAPILLARY
Glucose-Capillary: 133 mg/dL — ABNORMAL HIGH (ref 70–99)
Glucose-Capillary: 136 mg/dL — ABNORMAL HIGH (ref 70–99)
Glucose-Capillary: 152 mg/dL — ABNORMAL HIGH (ref 70–99)
Glucose-Capillary: 162 mg/dL — ABNORMAL HIGH (ref 70–99)
Glucose-Capillary: 173 mg/dL — ABNORMAL HIGH (ref 70–99)
Glucose-Capillary: 183 mg/dL — ABNORMAL HIGH (ref 70–99)

## 2020-12-21 LAB — BASIC METABOLIC PANEL
Anion gap: 10 (ref 5–15)
BUN: 14 mg/dL (ref 6–20)
CO2: 21 mmol/L — ABNORMAL LOW (ref 22–32)
Calcium: 8.6 mg/dL — ABNORMAL LOW (ref 8.9–10.3)
Chloride: 105 mmol/L (ref 98–111)
Creatinine, Ser: 0.99 mg/dL (ref 0.44–1.00)
GFR, Estimated: >60 mL/min (ref >60)
Glucose, Bld: 153 mg/dL — ABNORMAL HIGH (ref 70–99)
Potassium: 3.8 mmol/L (ref 3.5–5.1)
Sodium: 136 mmol/L (ref 135–145)

## 2020-12-21 NOTE — Progress Notes (Signed)
PROGRESS NOTE    Kathleen Gilmore  VZC:[id number] DOB: [birth year] DOA: 12/19/2020 PCP: Chester Holstein, MD    Brief Narrative:  Kathleen Gilmore is a 60 y.o. female with medical history significant for CVA with left-sided hemiparesis, anxiety disorder, diabetes mellitus, nicotine dependence who presents to the ER for evaluation of a 4-day history of abdominal pain mostly in the suprapubic area associated with bloating, constipation, nausea and multiple episodes of nonbloody, nonbilious emesis.  She rates her abdominal pain a 6 x 10 in intensity at its worst and it is nonradiating.  Patient states she has not had a bowel movement in 4 days which is unusual for her but has been passing gas.  She has also had shaking chills but denies having any fever. She has had frequency of urination as well as  Nocturia.  She also complains of midsternal chest pain which she describes as feeling like someone is stabbing her with a needle.  Pain is nonradiating and is not related to rest or exertion.  CT scan of abdomen and pelvis shows no acute findings in the abdomen or pelvis. Specifically, no findings to explain the patient's history of abdominal pain with constipation. Colon is largely decompressed throughout. Intestinal malrotation again noted. Hepatic steatosis  COVID-negative  5/15-has no abdominal pain , n/v this am. Reports feeling better than yesterday.    Consultants:     Procedures:   Antimicrobials:   Rocephin   Subjective: As above   Objective: Vitals:   12/20/20 1547 12/20/20 1959 12/21/20 0413 12/21/20 0727  BP: (!) 153/65 (!) 155/60 (!) 155/68 (!) 142/54  Pulse: (!) 53 (!) 53 (!) 52 (!) 52  Resp: 16 16 16 18   Temp: 98.4 F (36.9 C) 98.5 F (36.9 C) 98.2 F (36.8 C) 98.3 F (36.8 C)  TempSrc:  Oral Oral   SpO2: 99% 99% 98% 100%  Weight:      Height:        Intake/Output Summary (Last 24 hours) at 12/21/2020 0752 Last data filed at 12/21/2020 0513 Gross per 24 hour   Intake 2387.12 ml  Output --  Net 2387.12 ml   Filed Weights   12/19/20 0514  Weight: 95.3 kg    Examination:  NAD, calm CTA no wheeze rales rhonchi's Regular S1-S2 no gallops Soft benign positive bowel sounds No edema Awake and alert Mood and affect appropriate in current setting    Data Reviewed: I have personally reviewed following labs and imaging studies  CBC: Recent Labs  Lab 12/19/20 0602 12/20/20 0429 12/21/20 0425  WBC 13.3* 13.1* 9.7  NEUTROABS 11.2*  --   --   HGB 13.0 12.1 11.7*  HCT 37.9 35.0* 34.9*  MCV 82.4 81.8 81.9  PLT 210 235 128   Basic Metabolic Panel: Recent Labs  Lab 12/19/20 0602 12/20/20 0429 12/21/20 0425  NA 131* 133* 136  K 4.9 4.0 3.8  CL 97* 103 105  CO2 21* 22 21*  GLUCOSE 434* 220* 153*  BUN 19 16 14   CREATININE 1.14* 0.85 0.99  CALCIUM 9.2 8.5* 8.6*   GFR: Estimated Creatinine Clearance: 73 mL/min (by C-G formula based on SCr of 0.99 mg/dL). Liver Function Tests: Recent Labs  Lab 12/19/20 0602  AST 21  ALT 27  ALKPHOS 98  BILITOT 1.0  PROT 8.3*  ALBUMIN 3.4*   Recent Labs  Lab 12/19/20 0821  LIPASE 32   No results for input(s): AMMONIA in the last 168 hours. Coagulation Profile: No results for input(s):  INR, PROTIME in the last 168 hours. Cardiac Enzymes: No results for input(s): CKTOTAL, CKMB, CKMBINDEX, TROPONINI in the last 168 hours. BNP (last 3 results) No results for input(s): PROBNP in the last 8760 hours. HbA1C: Recent Labs    12/19/20 1050  HGBA1C 11.3*   CBG: Recent Labs  Lab 12/20/20 1549 12/20/20 2000 12/21/20 0020 12/21/20 0410 12/21/20 0749  GLUCAP 179* 181* 173* 162* 133*   Lipid Profile: No results for input(s): CHOL, HDL, LDLCALC, TRIG, CHOLHDL, LDLDIRECT in the last 72 hours. Thyroid Function Tests: No results for input(s): TSH, T4TOTAL, FREET4, T3FREE, THYROIDAB in the last 72 hours. Anemia Panel: No results for input(s): VITAMINB12, FOLATE, FERRITIN, TIBC, IRON,  RETICCTPCT in the last 72 hours. Sepsis Labs: No results for input(s): PROCALCITON, LATICACIDVEN in the last 168 hours.  Recent Results (from the past 240 hour(s))  Blood culture (routine x 2)     Status: None (Preliminary result)   Collection Time: 12/19/20  8:23 AM   Specimen: BLOOD  Result Value Ref Range Status   Specimen Description BLOOD BLOOD RIGHT HAND  Final   Special Requests   Final    BOTTLES DRAWN AEROBIC AND ANAEROBIC Blood Culture adequate volume   Culture  Setup Time   Final    Organism ID to follow GRAM NEGATIVE RODS AEROBIC BOTTLE ONLY CRITICAL RESULT CALLED TO, READ BACK BY AND VERIFIED WITH: JASON ROBBINS ON 12/20/20 AT 0117 QSD Performed at [facility], [address] [place], [place] [address]    Culture GRAM NEGATIVE RODS  Final   Report Status PENDING  Incomplete  Blood Culture ID Panel (Reflexed)     Status: Abnormal   Collection Time: 12/19/20  8:23 AM  Result Value Ref Range Status   Enterococcus faecalis NOT DETECTED NOT DETECTED Final   Enterococcus Faecium NOT DETECTED NOT DETECTED Final   Listeria monocytogenes NOT DETECTED NOT DETECTED Final   Staphylococcus species NOT DETECTED NOT DETECTED Final   Staphylococcus aureus (BCID) NOT DETECTED NOT DETECTED Final   Staphylococcus epidermidis NOT DETECTED NOT DETECTED Final   Staphylococcus lugdunensis NOT DETECTED NOT DETECTED Final   Streptococcus species NOT DETECTED NOT DETECTED Final   Streptococcus agalactiae NOT DETECTED NOT DETECTED Final   Streptococcus pneumoniae NOT DETECTED NOT DETECTED Final   Streptococcus pyogenes NOT DETECTED NOT DETECTED Final   A.calcoaceticus-baumannii NOT DETECTED NOT DETECTED Final   Bacteroides fragilis NOT DETECTED NOT DETECTED Final   Enterobacterales DETECTED (A) NOT DETECTED Final    Comment: Enterobacterales represent a large order of gram negative bacteria, not a single organism. CRITICAL RESULT CALLED TO, READ BACK BY AND VERIFIED WITH: JASON  ROBBINS ON 12/20/20 AT 0117 QSD    Enterobacter cloacae complex NOT DETECTED NOT DETECTED Final   Escherichia coli DETECTED (A) NOT DETECTED Final    Comment: CRITICAL RESULT CALLED TO, READ BACK BY AND VERIFIED WITH: JASON ROBBINS ON 12/20/20 AT 0117 QSD    Klebsiella aerogenes NOT DETECTED NOT DETECTED Final   Klebsiella oxytoca NOT DETECTED NOT DETECTED Final   Klebsiella pneumoniae NOT DETECTED NOT DETECTED Final   Proteus species NOT DETECTED NOT DETECTED Final   Salmonella species NOT DETECTED NOT DETECTED Final   Serratia marcescens NOT DETECTED NOT DETECTED Final   Haemophilus influenzae NOT DETECTED NOT DETECTED Final   Neisseria meningitidis NOT DETECTED NOT DETECTED Final   Pseudomonas aeruginosa NOT DETECTED NOT DETECTED Final   Stenotrophomonas maltophilia NOT DETECTED NOT DETECTED Final   Candida albicans NOT DETECTED NOT DETECTED  Final   Candida auris NOT DETECTED NOT DETECTED Final   Candida glabrata NOT DETECTED NOT DETECTED Final   Candida krusei NOT DETECTED NOT DETECTED Final   Candida parapsilosis NOT DETECTED NOT DETECTED Final   Candida tropicalis NOT DETECTED NOT DETECTED Final   Cryptococcus neoformans/gattii NOT DETECTED NOT DETECTED Final   CTX-M ESBL NOT DETECTED NOT DETECTED Final   Carbapenem resistance IMP NOT DETECTED NOT DETECTED Final   Carbapenem resistance KPC NOT DETECTED NOT DETECTED Final   Carbapenem resistance NDM NOT DETECTED NOT DETECTED Final   Carbapenem resist OXA 48 LIKE NOT DETECTED NOT DETECTED Final   Carbapenem resistance VIM NOT DETECTED NOT DETECTED Final    Comment: Performed at [facility], [address] [place], [place] [address]  Blood culture (routine x 2)     Status: None (Preliminary result)   Collection Time: 12/19/20  8:31 AM   Specimen: BLOOD  Result Value Ref Range Status   Specimen Description   Final    BLOOD BLOOD LEFT HAND Performed at [facility], [address] [place], [place]  [address]    Special Requests   Final    BOTTLES DRAWN AEROBIC AND ANAEROBIC Blood Culture adequate volume Performed at [facility], [address] [place], [place] [address]    Culture  Setup Time   Final    GRAM NEGATIVE RODS AEROBIC BOTTLE ONLY CRITICAL VALUE NOTED.  VALUE IS CONSISTENT WITH PREVIOUSLY REPORTED AND CALLED VALUE. Performed at [facility], [address] [place], [place] [address]    Culture GRAM NEGATIVE RODS  Final   Report Status PENDING  Incomplete  SARS CORONAVIRUS 2 (TAT 6-24 HRS) Nasopharyngeal Nasopharyngeal Swab     Status: None   Collection Time: 12/19/20  8:45 AM   Specimen: Nasopharyngeal Swab  Result Value Ref Range Status   SARS Coronavirus 2 NEGATIVE NEGATIVE Final    Comment: (NOTE) SARS-CoV-2 target nucleic acids are NOT DETECTED.  The SARS-CoV-2 RNA is generally detectable in upper and lower respiratory specimens during the acute phase of infection. Negative results do not preclude SARS-CoV-2 infection, do not rule out co-infections with other pathogens, and should not be used as the sole basis for treatment or other patient management decisions. Negative results must be combined with clinical observations, patient history, and epidemiological information. The expected result is Negative.  Fact Sheet for Patients: SugarRoll.be  Fact Sheet for Healthcare Providers: https://www.woods-mathews.com/  This test is not yet approved or cleared by the [place] FDA and  has been authorized for detection and/or diagnosis of SARS-CoV-2 by FDA under an Emergency Use Authorization (EUA). This EUA will remain  in effect (meaning this test can be used) for the duration of the COVID-19 declaration under Se ction 564(b)(1) of the Act, 21 U.S.C. section 360bbb-3(b)(1), unless the authorization is terminated or revoked sooner.  Performed at [facility], [address]  [place], [place] [address]          Radiology Studies: No results found.      Scheduled Meds: . aspirin EC  81 mg Oral Daily  . clonazePAM  0.5 mg Oral BID  . enoxaparin (LOVENOX) injection  0.5 mg/kg Subcutaneous Q24H  . insulin aspart  0-15 Units Subcutaneous Q4H  . insulin glargine  5 Units Subcutaneous Daily  . metoCLOPramide (REGLAN) injection  10 mg Intravenous Q8H  . mometasone-formoterol  2 puff Inhalation BID  . nicotine  14 mg Transdermal Daily  . pantoprazole (  PROTONIX) IV  40 mg Intravenous Q24H  . perphenazine  4 mg Oral BID  . simvastatin  20 mg Oral QHS  . traZODone  100 mg Oral QHS   Continuous Infusions: . sodium chloride 75 mL/hr at 12/21/20 0513  . cefTRIAXone (ROCEPHIN)  IV Stopped (12/20/20 1851)    Assessment & Plan:   Principal Problem:   Acute lower UTI Active Problems:   Diabetes mellitus with hyperglycemia (HCC)   Refractory nausea and vomiting   CVA, old, hemiparesis (HCC)   Nicotine dependence   COPD (chronic obstructive pulmonary disease) (HCC)   Anxiety and depression   Urinary tract infection Patient presents for evaluation of suprapubic pain associated with chills, nausea and vomiting 5/15 -leukocytosis improved  urine culture +GN rod.  Blood culture final results sensitivity still pending  We will continue Rocephin for now and once culture results are back we will narrow to appropriate antibiotic therapy     E.Coli Bacteremia- likely 2/2 UTI UA +  Leukocytosis improved Hemodynamically stable Continue Rocephin  Follow-up final blood culture results and sensitivities      Diabetes mellitus with hyperglycemia BG stable  Hold metformin  Continue Lantus  R ISS       Refractory nausea and vomiting ??  Diabetic gastroparesis v.s. bacteremia/UTI 5/15 appears symptoms have improved we will monitor today  Continue supportive care /IVF     Nicotine dependence Patient smokes 1/2 pack of cigarettes  daily Smoking cessation has been discussed with patient in detail We will place patient on a nicotine transdermal patch 14 mg daily     COPD Not acutely exacerbated Continue as needed bronchodilator therapy and inhaled steroids    History of CVA with left-sided weakness Continue aspirin and statins    Depression and anxiety Continue clonazepam, trazodone and perphenazine   DVT prophylaxis: Lovenox Code Status: Full code Family Communication: updated sister Disposition Plan:  Status is: Inpatient  Remains inpatient appropriate because:Inpatient level of care appropriate due to severity of illness   Dispo: The patient is from: Home              Anticipated d/c is to: Home              Patient currently is not medically stable to d/c.   Difficult to place patient No    Anticipated discharge; once bcx results /sensitivities back can dc home with appropriate abx.currently needs ivabx        LOS: 2 days   Time spent: 35 minutes with more than 50% on [address], MD [facility] Pager [phone]  If 7PM-7AM, please contact night-coverage 12/21/2020, 7:52 AM

## 2020-12-22 LAB — CULTURE, BLOOD (ROUTINE X 2)
Special Requests: ADEQUATE
Special Requests: ADEQUATE

## 2020-12-22 LAB — GLUCOSE, CAPILLARY
Glucose-Capillary: 130 mg/dL — ABNORMAL HIGH (ref 70–99)
Glucose-Capillary: 145 mg/dL — ABNORMAL HIGH (ref 70–99)
Glucose-Capillary: 148 mg/dL — ABNORMAL HIGH (ref 70–99)

## 2020-12-22 LAB — URINE CULTURE: Culture: >=100000 — AB

## 2020-12-22 MED ORDER — AMOXICILLIN 500 MG PO CAPS: 1000.0000 mg | ORAL_CAPSULE | Freq: Three times a day (TID) | ORAL | 0 refills | Status: AC

## 2020-12-22 MED ORDER — RISAQUAD PO CAPS: 2.0000 | ORAL_CAPSULE | Freq: Two times a day (BID) | ORAL | Status: DC

## 2020-12-22 MED ORDER — SIMVASTATIN 20 MG PO TABS: 20.0000 mg | ORAL_TABLET | Freq: Every day | ORAL | 0 refills | Status: AC

## 2020-12-22 MED ORDER — ALUM & MAG HYDROXIDE-SIMETH 200-200-20 MG/5ML PO SUSP
30.0000 mL | Freq: Four times a day (QID) | ORAL | Status: DC | PRN
  Administered 2020-12-22: 30 mL via ORAL
  Filled 2020-12-22: qty 30

## 2020-12-22 MED ORDER — RISAQUAD PO CAPS: 2.0000 | ORAL_CAPSULE | Freq: Two times a day (BID) | ORAL | 0 refills | Status: AC

## 2020-12-22 NOTE — Discharge Summary (Signed)
Kathleen Gilmore VXB:[id number] DOB: December 02, 1960 DOA: 12/19/2020  PCP: Chester Holstein, MD  Admit date: 12/19/2020 Discharge date: 12/22/2020  Admitted From: home Disposition:  home  Recommendations for Outpatient Follow-up:  1. Follow up with PCP in 1 week 2. Please obtain BMP/CBC in one week     Discharge Condition:Stable CODE STATUS:full  Diet recommendation: carb modified   Brief/Interim Summary: Per HPI: Kathleen Gilmore is a 60 y.o. female with medical history significant for CVA with left-sided hemiparesis, anxiety disorder, diabetes mellitus, nicotine dependence who presents to the ER for evaluation of a 4-day history of abdominal pain mostly in the suprapubic area associated with bloating, constipation, nausea and multiple episodes of nonbloody, nonbilious emesis.  She reported her abdominal pain a 6 x 10 in intensity at its worst and it is nonradiating.  Patient stated she has not had a bowel movement in 4 days which is unusual for her but has been passing gas.  She has also had shaking chills but denies having any fever. She has had frequency of urination as well as  Nocturia.  She also complained of midsternal chest pain which she describes as feeling like someone is stabbing her with a needle.  Pain is nonradiating and is not related to rest or exertion.CT scan of abdomen and pelvis shows no acute findings in the abdomen or pelvis (full report below). Her UA was positive. Blood cultures came back positive too for E.Coli. She was started on IV Rocephin. Covid test negative.   Active Problems:   Diabetes mellitus with hyperglycemia (HCC)   Refractory nausea and vomiting   CVA, old, hemiparesis (HCC)   Nicotine dependence   COPD (chronic obstructive pulmonary disease) (HCC)   Anxiety and depression   Urinary tract infection Patient presents for evaluation of suprapubic pain associated with chills, nausea and vomiting leukocytosis improved  Urine culture positive for E.  coli Patient will be discharged on p.o. antibiotics to complete 10 more days of treatment    E.Coli Bacteremia- likely 2/2 UTI urine culture positive for E. coli Leukocytosis improved Hemodynamically stable Discussed with ID pharmacy and recommended amoxicillin to complete 10 more days. Added probiotics     Diabetes mellitus with hyperglycemia BG stable  Continue home metformin Carb controlled diet      Refractory nausea and vomiting Likely from  bacteremia/UTI Improved CT of abdomen pelvis negative for acute abnormality please see report below     Nicotine dependence Patient smokes 1/2 pack of cigarettes daily Smoking cessation has been discussed with patient in detail      COPD Not acutely exacerbated Continue home inhalers    History of CVA with left-sided weakness Continue aspirin and statins    Depression and anxiety Continue clonazepam, trazodone andperphenazine Will need to follow-up with PCP or her psychiatrist to make sure she is on appropriate medication dosage    Discharge Diagnoses:  Principal Problem:   Acute lower UTI Active Problems:   Diabetes mellitus with hyperglycemia ([place])   Refractory nausea and vomiting   CVA, old, hemiparesis ([place])   Nicotine dependence   COPD (chronic obstructive pulmonary disease) (HCC)   Anxiety and depression    Discharge Instructions  Discharge Instructions    Call MD for:  temperature >100.4   Complete by: As directed    Diet - low sodium heart healthy   Complete by: As directed    Diet Carb Modified   Complete by: As directed    Discharge instructions   Complete by: As directed  F/u with pcp in one week, need to have pcp review your home meds dosages   Increase activity slowly   Complete by: As directed      Allergies as of 12/22/2020   No Known Allergies     Medication List    TAKE these medications   acidophilus Caps capsule Take 2 capsules by  mouth 2 (two) times daily for 10 days.   amoxicillin 500 MG capsule Commonly known as: AMOXIL Take 2 capsules (1,000 mg total) by mouth 3 (three) times daily for 10 days. Start taking on: Dec 23, 2020   aspirin EC 81 MG tablet Take 81 mg by mouth daily.   budesonide-formoterol 160-4.5 MCG/ACT inhaler Commonly known as: SYMBICORT Inhale 2 puffs into the lungs 2 (two) times daily.   clonazePAM 0.5 MG tablet Commonly known as: KLONOPIN Take 0.5 mg by mouth 3 (three) times daily as needed for anxiety.   cyclobenzaprine 10 MG tablet Commonly known as: FLEXERIL Take 1 tablet (10 mg total) by mouth 3 (three) times daily as needed. What changed:   how much to take  reasons to take this   gabapentin 300 MG capsule Commonly known as: NEURONTIN Take 300 mg by mouth 2 (two) times daily.   hydrOXYzine 50 MG tablet Commonly known as: ATARAX/VISTARIL Take 50 mg by mouth 2 (two) times daily as needed for anxiety.   metFORMIN 500 MG tablet Commonly known as: GLUCOPHAGE Take 500 mg by mouth daily.   perphenazine 8 MG tablet Commonly known as: TRILAFON Take 4-8 mg by mouth See admin instructions. Take 1 tablet (8mg ) by mouth twice daily and take  tablet (4mg ) by mouth every night   simvastatin 20 MG tablet Commonly known as: ZOCOR Take 1 tablet (20 mg total) by mouth at bedtime. What changed:   medication strength  how much to take  when to take this   tiZANidine 2 MG tablet Commonly known as: ZANAFLEX Take 2 mg by mouth every 6 (six) hours as needed for muscle spasms.   traMADol 50 MG tablet Commonly known as: ULTRAM Take 1 tablet (50 mg total) by mouth every 6 (six) hours as needed. What changed:   how much to take  when to take this  reasons to take this   traZODone 100 MG tablet Commonly known as: DESYREL Take 100 mg by mouth at bedtime.       Follow-up Information    Chester Holstein, MD Follow up in 1 week(s).   Specialty: Family Medicine              No Known Allergies  Consultations:     Procedures/Studies: CT ABDOMEN PELVIS WO CONTRAST  Result Date: 12/19/2020 CLINICAL DATA:  Abdominal pain and constipation. EXAM: CT ABDOMEN AND PELVIS WITHOUT CONTRAST TECHNIQUE: Multidetector CT imaging of the abdomen and pelvis was performed following the standard protocol without IV contrast. COMPARISON:  04/03/2020 FINDINGS: Lower chest: Unremarkable Hepatobiliary: The liver shows diffusely decreased attenuation suggesting fat deposition, progressive in the interval. Nonvisualization of the gallbladder is compatible with decompression or surgical absence. No intrahepatic or extrahepatic biliary dilation. Pancreas: No focal mass lesion. No dilatation of the main duct. No intraparenchymal cyst. No peripancreatic edema. Spleen: No splenomegaly. No focal mass lesion. Adrenals/Urinary Tract: No adrenal nodule or mass. Kidneys unremarkable. No evidence for hydroureter. The urinary bladder appears normal for the degree of distention. Stomach/Bowel: Tiny hiatal hernia. Stomach otherwise unremarkable. Duodenum does not cross the midline and small bowel is seen clustered in  the right abdomen. The appendix is normal. Colon fills the left abdomen. There is no small bowel or colonic dilatation. Vascular/Lymphatic: There is abdominal aortic atherosclerosis without aneurysm. There is no gastrohepatic or hepatoduodenal ligament lymphadenopathy. No retroperitoneal or mesenteric lymphadenopathy. No pelvic sidewall lymphadenopathy. Reproductive: The uterus is unremarkable.  There is no adnexal mass. Other: No intraperitoneal free fluid. Musculoskeletal: No worrisome lytic or sclerotic osseous abnormality. IMPRESSION: 1. No acute findings in the abdomen or pelvis. Specifically, no findings to explain the patient's history of abdominal pain with constipation. Colon is largely decompressed throughout. 2. Intestinal malrotation again noted. 3. Hepatic steatosis. 4. Aortic  Atherosclerosis (ICD10-I70.0). Electronically Signed   By: Misty Stanley M.D.   On: 12/19/2020 07:30   DG Knee Complete 4 Views Right  Result Date: 12/05/2020 CLINICAL DATA:  Right knee pain.  No known injury. EXAM: RIGHT KNEE - COMPLETE 4+ VIEW COMPARISON:  Plain films right knee 10/23/2017. FINDINGS: No acute bony or joint abnormality is identified. Joint spaces are preserved. Cluster of small subcutaneous calcifications in the anterior, medial soft tissues is unchanged and may be due to some prior inflammatory process. Joint spaces are preserved. IMPRESSION: Negative exam. Electronically Signed   By: Inge Rise M.D.   On: 12/05/2020 14:03      Subjective: Feels well. Wants to go home. Denies chills, fever, abd pain, n/v  Discharge Exam: Vitals:   12/22/20 0434 12/22/20 0806  BP: (!) 144/64 129/67  Pulse: 71 74  Resp: 18 17  Temp: 99 F (37.2 C) 98.4 F (36.9 C)  SpO2: 97% 99%   Vitals:   12/21/20 1957 12/22/20 0008 12/22/20 0434 12/22/20 0806  BP: 116/61 134/69 (!) 144/64 129/67  Pulse: 69 83 71 74  Resp: 16  18 17   Temp: 98.9 F (37.2 C) 98 F (36.7 C) 99 F (37.2 C) 98.4 F (36.9 C)  TempSrc: Oral Oral Oral   SpO2: 98% 97% 97% 99%  Weight:      Height:        General: Pt is alert, awake, not in acute distress Cardiovascular: RRR, S1/S2 +, no rubs, no gallops Respiratory: CTA bilaterally, no wheezing, no rhonchi Abdominal: Soft, NT, ND, bowel sounds + Extremities: no edema    The results of significant diagnostics from this hospitalization (including imaging, microbiology, ancillary and laboratory) are listed below for reference.     Microbiology: Recent Results (from the past 240 hour(s))  Urine Culture     Status: Abnormal   Collection Time: 12/19/20  7:41 AM   Specimen: Urine, Random  Result Value Ref Range Status   Specimen Description   Final    URINE, RANDOM Performed at [facility], [address], [place], [place] [address]     Special Requests   Final    NONE Performed at [facility], [address] [place], [place] [address]    Culture >=100,000 COLONIES/mL ESCHERICHIA COLI (A)  Final   Report Status 12/22/2020 FINAL  Final   Organism ID, Bacteria ESCHERICHIA COLI (A)  Final      Susceptibility   Escherichia coli - MIC*    AMPICILLIN <=2 SENSITIVE Sensitive     CEFAZOLIN <=4 SENSITIVE Sensitive     CEFEPIME <=0.12 SENSITIVE Sensitive     CEFTRIAXONE <=0.25 SENSITIVE Sensitive     CIPROFLOXACIN <=0.25 SENSITIVE Sensitive     GENTAMICIN <=1 SENSITIVE Sensitive     IMIPENEM <=0.25 SENSITIVE Sensitive     NITROFURANTOIN <=16 SENSITIVE Sensitive  TRIMETH/SULFA <=20 SENSITIVE Sensitive     AMPICILLIN/SULBACTAM <=2 SENSITIVE Sensitive     PIP/TAZO <=4 SENSITIVE Sensitive     * >=100,000 COLONIES/mL ESCHERICHIA COLI  Blood culture (routine x 2)     Status: Abnormal   Collection Time: 12/19/20  8:23 AM   Specimen: BLOOD  Result Value Ref Range Status   Specimen Description   Final    BLOOD BLOOD RIGHT HAND Performed at  [facility], [address], [place], [place] [address]    Special Requests   Final    BOTTLES DRAWN AEROBIC AND ANAEROBIC Blood Culture adequate volume Performed at [facility], [address] [place], [place] [address]    Culture  Setup Time   Final    GRAM NEGATIVE RODS AEROBIC BOTTLE ONLY CRITICAL RESULT CALLED TO, READ BACK BY AND VERIFIED WITH: JASON ROBBINS ON 12/20/20 AT 0117 QSD Performed at [facility], [place] [address], [place], [place] [address]    Culture ESCHERICHIA COLI (A)  Final   Report Status 12/22/2020 FINAL  Final   Organism ID, Bacteria ESCHERICHIA COLI  Final      Susceptibility   Escherichia coli - MIC*    AMPICILLIN <=2 SENSITIVE Sensitive     CEFAZOLIN <=4 SENSITIVE Sensitive     CEFEPIME <=0.12 SENSITIVE Sensitive     CEFTAZIDIME <=1 SENSITIVE Sensitive     CEFTRIAXONE <=0.25 SENSITIVE Sensitive     CIPROFLOXACIN  <=0.25 SENSITIVE Sensitive     GENTAMICIN <=1 SENSITIVE Sensitive     IMIPENEM <=0.25 SENSITIVE Sensitive     TRIMETH/SULFA <=20 SENSITIVE Sensitive     AMPICILLIN/SULBACTAM <=2 SENSITIVE Sensitive     PIP/TAZO <=4 SENSITIVE Sensitive     * ESCHERICHIA COLI  Blood Culture ID Panel (Reflexed)     Status: Abnormal   Collection Time: 12/19/20  8:23 AM  Result Value Ref Range Status   Enterococcus faecalis NOT DETECTED NOT DETECTED Final   Enterococcus Faecium NOT DETECTED NOT DETECTED Final   Listeria monocytogenes NOT DETECTED NOT DETECTED Final   Staphylococcus species NOT DETECTED NOT DETECTED Final   Staphylococcus aureus (BCID) NOT DETECTED NOT DETECTED Final   Staphylococcus epidermidis NOT DETECTED NOT DETECTED Final   Staphylococcus lugdunensis NOT DETECTED NOT DETECTED Final   Streptococcus species NOT DETECTED NOT DETECTED Final   Streptococcus agalactiae NOT DETECTED NOT DETECTED Final   Streptococcus pneumoniae NOT DETECTED NOT DETECTED Final   Streptococcus pyogenes NOT DETECTED NOT DETECTED Final   A.calcoaceticus-baumannii NOT DETECTED NOT DETECTED Final   Bacteroides fragilis NOT DETECTED NOT DETECTED Final   Enterobacterales DETECTED (A) NOT DETECTED Final    Comment: Enterobacterales represent a large order of gram negative bacteria, not a single organism. CRITICAL RESULT CALLED TO, READ BACK BY AND VERIFIED WITH: JASON ROBBINS ON 12/20/20 AT 0117 QSD    Enterobacter cloacae complex NOT DETECTED NOT DETECTED Final   Escherichia coli DETECTED (A) NOT DETECTED Final    Comment: CRITICAL RESULT CALLED TO, READ BACK BY AND VERIFIED WITH: JASON ROBBINS ON 12/20/20 AT 0117 QSD    Klebsiella aerogenes NOT DETECTED NOT DETECTED Final   Klebsiella oxytoca NOT DETECTED NOT DETECTED Final   Klebsiella pneumoniae NOT DETECTED NOT DETECTED Final   Proteus species NOT DETECTED NOT DETECTED Final   Salmonella species NOT DETECTED NOT DETECTED Final   Serratia marcescens NOT  DETECTED NOT DETECTED Final   Haemophilus influenzae NOT DETECTED NOT DETECTED Final   Neisseria meningitidis NOT DETECTED NOT DETECTED Final  Pseudomonas aeruginosa NOT DETECTED NOT DETECTED Final   Stenotrophomonas maltophilia NOT DETECTED NOT DETECTED Final   Candida albicans NOT DETECTED NOT DETECTED Final   Candida auris NOT DETECTED NOT DETECTED Final   Candida glabrata NOT DETECTED NOT DETECTED Final   Candida krusei NOT DETECTED NOT DETECTED Final   Candida parapsilosis NOT DETECTED NOT DETECTED Final   Candida tropicalis NOT DETECTED NOT DETECTED Final   Cryptococcus neoformans/gattii NOT DETECTED NOT DETECTED Final   CTX-M ESBL NOT DETECTED NOT DETECTED Final   Carbapenem resistance IMP NOT DETECTED NOT DETECTED Final   Carbapenem resistance KPC NOT DETECTED NOT DETECTED Final   Carbapenem resistance NDM NOT DETECTED NOT DETECTED Final   Carbapenem resist OXA 48 LIKE NOT DETECTED NOT DETECTED Final   Carbapenem resistance VIM NOT DETECTED NOT DETECTED Final    Comment: Performed at [facility], [address] [place], [place] [address]  Blood culture (routine x 2)     Status: Abnormal   Collection Time: 12/19/20  8:31 AM   Specimen: BLOOD  Result Value Ref Range Status   Specimen Description   Final    BLOOD BLOOD LEFT HAND Performed at [facility], [address] [place], [place] [address]    Special Requests   Final    BOTTLES DRAWN AEROBIC AND ANAEROBIC Blood Culture adequate volume Performed at [facility], [address] [place], [place] [address]    Culture  Setup Time   Final    GRAM NEGATIVE RODS AEROBIC BOTTLE ONLY CRITICAL VALUE NOTED.  VALUE IS CONSISTENT WITH PREVIOUSLY REPORTED AND CALLED VALUE. Performed at [facility], [address] [place], [place] [address]    Culture (A)  Final    ESCHERICHIA COLI SUSCEPTIBILITIES PERFORMED ON PREVIOUS CULTURE WITHIN THE LAST 5 DAYS. Performed at [facility], [place] [address] [place], [place] [address]    Report Status 12/22/2020 FINAL  Final  SARS CORONAVIRUS 2 (TAT 6-24 HRS) Nasopharyngeal Nasopharyngeal Swab     Status: None   Collection Time: 12/19/20  8:45 AM   Specimen: Nasopharyngeal Swab  Result Value Ref Range Status   SARS Coronavirus 2 NEGATIVE NEGATIVE Final    Comment: (NOTE) SARS-CoV-2 target nucleic acids are NOT DETECTED.  The SARS-CoV-2 RNA is generally detectable in upper and lower respiratory specimens during the acute phase of infection. Negative results do not preclude SARS-CoV-2 infection, do not rule out co-infections with other pathogens, and should not be used as the sole basis for treatment or other patient management decisions. Negative results must be combined with clinical observations, patient history, and epidemiological information. The expected result is Negative.  Fact Sheet for Patients: SugarRoll.be  Fact Sheet for Healthcare Providers: https://www.woods-mathews.com/  This test is not yet approved or cleared by the [place] FDA and  has been authorized for detection and/or diagnosis of SARS-CoV-2 by FDA under an Emergency Use Authorization (EUA). This EUA will remain  in effect (meaning this test can be used) for the duration of the COVID-19 declaration under Se ction 564(b)(1) of the Act, 21 U.S.C. section 360bbb-3(b)(1), unless the authorization is terminated or revoked sooner.  Performed at [facility], [place] [address], [place], [place] [address]      Labs: BNP (last 3 results) No results for input(s): BNP in the last 8760 hours. Basic Metabolic Panel: Recent Labs  Lab 12/19/20 0602 12/20/20 0429 12/21/20 0425  NA 131* 133* 136  K 4.9 4.0 3.8  CL  97* 103 105  CO2 21* 22 21*  GLUCOSE 434* 220* 153*  BUN 19 16 14   CREATININE 1.14* 0.85 0.99  CALCIUM 9.2 8.5* 8.6*   Liver Function Tests: Recent Labs  Lab 12/19/20 0602  AST 21   ALT 27  ALKPHOS 98  BILITOT 1.0  PROT 8.3*  ALBUMIN 3.4*   Recent Labs  Lab 12/19/20 0821  LIPASE 32   No results for input(s): AMMONIA in the last 168 hours. CBC: Recent Labs  Lab 12/19/20 0602 12/20/20 0429 12/21/20 0425  WBC 13.3* 13.1* 9.7  NEUTROABS 11.2*  --   --   HGB 13.0 12.1 11.7*  HCT 37.9 35.0* 34.9*  MCV 82.4 81.8 81.9  PLT 210 235 232   Cardiac Enzymes: No results for input(s): CKTOTAL, CKMB, CKMBINDEX, TROPONINI in the last 168 hours. BNP: Invalid input(s): POCBNP CBG: Recent Labs  Lab 12/21/20 1645 12/21/20 1958 12/22/20 0017 12/22/20 0422 12/22/20 0804  GLUCAP 183* 152* 145* 148* 130*   D-Dimer No results for input(s): DDIMER in the last 72 hours. Hgb A1c Recent Labs    12/19/20 1050  HGBA1C 11.3*   Lipid Profile No results for input(s): CHOL, HDL, LDLCALC, TRIG, CHOLHDL, LDLDIRECT in the last 72 hours. Thyroid function studies No results for input(s): TSH, T4TOTAL, T3FREE, THYROIDAB in the last 72 hours.  Invalid input(s): FREET3 Anemia work up No results for input(s): VITAMINB12, FOLATE, FERRITIN, TIBC, IRON, RETICCTPCT in the last 72 hours. Urinalysis    Component Value Date/Time   COLORURINE YELLOW (A) 12/19/2020 0741   APPEARANCEUR CLEAR (A) 12/19/2020 0741   LABSPEC 1.023 12/19/2020 0741   PHURINE 5.0 12/19/2020 0741   GLUCOSEU >=500 (A) 12/19/2020 0741   HGBUR SMALL (A) 12/19/2020 0741   BILIRUBINUR NEGATIVE 12/19/2020 0741   KETONESUR 80 (A) 12/19/2020 0741   PROTEINUR 30 (A) 12/19/2020 0741   NITRITE POSITIVE (A) 12/19/2020 0741   LEUKOCYTESUR NEGATIVE 12/19/2020 0741   Sepsis Labs Invalid input(s): PROCALCITONIN,  WBC,  LACTICIDVEN Microbiology Recent Results (from the past 240 hour(s))  Urine Culture     Status: Abnormal   Collection Time: 12/19/20  7:41 AM   Specimen: Urine, Random  Result Value Ref Range Status   Specimen Description   Final    URINE, RANDOM Performed at [facility], [address] [place], [place] [address]    Special Requests   Final    NONE Performed at [facility], [place], [place] [address]    Culture >=100,000 COLONIES/mL ESCHERICHIA COLI (A)  Final   Report Status 12/22/2020 FINAL  Final   Organism ID, Bacteria ESCHERICHIA COLI (A)  Final      Susceptibility   Escherichia coli - MIC*    AMPICILLIN <=2 SENSITIVE Sensitive     CEFAZOLIN <=4 SENSITIVE Sensitive     CEFEPIME <=0.12 SENSITIVE Sensitive     CEFTRIAXONE <=0.25 SENSITIVE Sensitive     CIPROFLOXACIN <=0.25 SENSITIVE Sensitive     GENTAMICIN <=1 SENSITIVE Sensitive     IMIPENEM <=0.25 SENSITIVE Sensitive     NITROFURANTOIN <=16 SENSITIVE Sensitive     TRIMETH/SULFA <=20 SENSITIVE Sensitive     AMPICILLIN/SULBACTAM <=2 SENSITIVE Sensitive     PIP/TAZO <=4 SENSITIVE Sensitive     * >=100,000 COLONIES/mL ESCHERICHIA COLI  Blood culture (routine x 2)     Status: Abnormal   Collection Time: 12/19/20  8:23 AM   Specimen: BLOOD  Result Value Ref Range Status   Specimen Description  Final    BLOOD BLOOD RIGHT HAND Performed at [facility], [address] [place], [place] [address]    Special Requests   Final    BOTTLES DRAWN AEROBIC AND ANAEROBIC Blood Culture adequate volume Performed at [place]., [place], [place] [address]    Culture  Setup Time   Final    GRAM NEGATIVE RODS AEROBIC BOTTLE ONLY CRITICAL RESULT CALLED TO, READ BACK BY AND VERIFIED WITH: JASON ROBBINS ON 12/20/20 AT 0117 QSD Performed at [facility] [address] [place], [place] [address]    Culture ESCHERICHIA COLI (A)  Final   Report Status 12/22/2020 FINAL  Final   Organism ID, Bacteria ESCHERICHIA COLI  Final      Susceptibility   Escherichia coli - MIC*    AMPICILLIN <=2 SENSITIVE Sensitive     CEFAZOLIN <=4 SENSITIVE Sensitive     CEFEPIME <=0.12 SENSITIVE Sensitive     CEFTAZIDIME <=1 SENSITIVE Sensitive     CEFTRIAXONE <=0.25  SENSITIVE Sensitive     CIPROFLOXACIN <=0.25 SENSITIVE Sensitive     GENTAMICIN <=1 SENSITIVE Sensitive     IMIPENEM <=0.25 SENSITIVE Sensitive     TRIMETH/SULFA <=20 SENSITIVE Sensitive     AMPICILLIN/SULBACTAM <=2 SENSITIVE Sensitive     PIP/TAZO <=4 SENSITIVE Sensitive     * ESCHERICHIA COLI  Blood Culture ID Panel (Reflexed)     Status: Abnormal   Collection Time: 12/19/20  8:23 AM  Result Value Ref Range Status   Enterococcus faecalis NOT DETECTED NOT DETECTED Final   Enterococcus Faecium NOT DETECTED NOT DETECTED Final   Listeria monocytogenes NOT DETECTED NOT DETECTED Final   Staphylococcus species NOT DETECTED NOT DETECTED Final   Staphylococcus aureus (BCID) NOT DETECTED NOT DETECTED Final   Staphylococcus epidermidis NOT DETECTED NOT DETECTED Final   Staphylococcus lugdunensis NOT DETECTED NOT DETECTED Final   Streptococcus species NOT DETECTED NOT DETECTED Final   Streptococcus agalactiae NOT DETECTED NOT DETECTED Final   Streptococcus pneumoniae NOT DETECTED NOT DETECTED Final   Streptococcus pyogenes NOT DETECTED NOT DETECTED Final   A.calcoaceticus-baumannii NOT DETECTED NOT DETECTED Final   Bacteroides fragilis NOT DETECTED NOT DETECTED Final   Enterobacterales DETECTED (A) NOT DETECTED Final    Comment: Enterobacterales represent a large order of gram negative bacteria, not a single organism. CRITICAL RESULT CALLED TO, READ BACK BY AND VERIFIED WITH: JASON ROBBINS ON 12/20/20 AT 0117 QSD    Enterobacter cloacae complex NOT DETECTED NOT DETECTED Final   Escherichia coli DETECTED (A) NOT DETECTED Final    Comment: CRITICAL RESULT CALLED TO, READ BACK BY AND VERIFIED WITH: JASON ROBBINS ON 12/20/20 AT 0117 QSD    Klebsiella aerogenes NOT DETECTED NOT DETECTED Final   Klebsiella oxytoca NOT DETECTED NOT DETECTED Final   Klebsiella pneumoniae NOT DETECTED NOT DETECTED Final   Proteus species NOT DETECTED NOT DETECTED Final   Salmonella species NOT DETECTED NOT DETECTED  Final   Serratia marcescens NOT DETECTED NOT DETECTED Final   Haemophilus influenzae NOT DETECTED NOT DETECTED Final   Neisseria meningitidis NOT DETECTED NOT DETECTED Final   Pseudomonas aeruginosa NOT DETECTED NOT DETECTED Final   Stenotrophomonas maltophilia NOT DETECTED NOT DETECTED Final   Candida albicans NOT DETECTED NOT DETECTED Final   Candida auris NOT DETECTED NOT DETECTED Final   Candida glabrata NOT DETECTED NOT DETECTED Final   Candida krusei NOT DETECTED NOT DETECTED Final   Candida parapsilosis NOT DETECTED NOT DETECTED Final   Candida  tropicalis NOT DETECTED NOT DETECTED Final   Cryptococcus neoformans/gattii NOT DETECTED NOT DETECTED Final   CTX-M ESBL NOT DETECTED NOT DETECTED Final   Carbapenem resistance IMP NOT DETECTED NOT DETECTED Final   Carbapenem resistance KPC NOT DETECTED NOT DETECTED Final   Carbapenem resistance NDM NOT DETECTED NOT DETECTED Final   Carbapenem resist OXA 48 LIKE NOT DETECTED NOT DETECTED Final   Carbapenem resistance VIM NOT DETECTED NOT DETECTED Final    Comment: Performed at [facility], [address] [place] [place], [place] [address]  Blood culture (routine x 2)     Status: Abnormal   Collection Time: 12/19/20  8:31 AM   Specimen: BLOOD  Result Value Ref Range Status   Specimen Description   Final    BLOOD BLOOD LEFT HAND Performed at [facility], [address], [place], [place] [address]    Special Requests   Final    BOTTLES DRAWN AEROBIC AND ANAEROBIC Blood Culture adequate volume Performed at [facility], [address] [place], [place] [address]    Culture  Setup Time   Final    GRAM NEGATIVE RODS AEROBIC BOTTLE ONLY CRITICAL VALUE NOTED.  VALUE IS CONSISTENT WITH PREVIOUSLY REPORTED AND CALLED VALUE. Performed at [facility], [address] [place], [place] [address]    Culture (A)  Final    ESCHERICHIA COLI SUSCEPTIBILITIES PERFORMED ON PREVIOUS CULTURE WITHIN THE LAST 5  DAYS. Performed at [facility], [place] [address] [place], [place] [address]    Report Status 12/22/2020 FINAL  Final  SARS CORONAVIRUS 2 (TAT 6-24 HRS) Nasopharyngeal Nasopharyngeal Swab     Status: None   Collection Time: 12/19/20  8:45 AM   Specimen: Nasopharyngeal Swab  Result Value Ref Range Status   SARS Coronavirus 2 NEGATIVE NEGATIVE Final    Comment: (NOTE) SARS-CoV-2 target nucleic acids are NOT DETECTED.  The SARS-CoV-2 RNA is generally detectable in upper and lower respiratory specimens during the acute phase of infection. Negative results do not preclude SARS-CoV-2 infection, do not rule out co-infections with other pathogens, and should not be used as the sole basis for treatment or other patient management decisions. Negative results must be combined with clinical observations, patient history, and epidemiological information. The expected result is Negative.  Fact Sheet for Patients: SugarRoll.be  Fact Sheet for Healthcare Providers: https://www.woods-mathews.com/  This test is not yet approved or cleared by the [place] FDA and  has been authorized for detection and/or diagnosis of SARS-CoV-2 by FDA under an Emergency Use Authorization (EUA). This EUA will remain  in effect (meaning this test can be used) for the duration of the COVID-19 declaration under Se ction 564(b)(1) of the Act, 21 U.S.C. section 360bbb-3(b)(1), unless the authorization is terminated or revoked sooner.  Performed at [facility], [place] [address] [place], [place] [address]      Time coordinating discharge: Over 30 minutes  SIGNED:   Nolberto Hanlon, MD  [facility] 12/22/2020, 10:41 AM Pager   If 7PM-7AM, please contact night-coverage [email or web address] Password [email or web address]

## 2021-08-14 ENCOUNTER — Other Ambulatory Visit: Payer: Self-pay

## 2021-08-14 ENCOUNTER — Encounter: Payer: Self-pay | Admitting: Emergency Medicine

## 2021-08-14 ENCOUNTER — Emergency Department: Payer: Medicare Other

## 2021-08-14 ENCOUNTER — Emergency Department
Admission: EM | Admit: 2021-08-14 | Discharge: 2021-08-14 | Disposition: A | Discharge status: Home / Self Care | Payer: Medicare Other | Attending: Emergency Medicine | Admitting: Emergency Medicine

## 2021-08-14 DIAGNOSIS — J45909 Unspecified asthma, uncomplicated: Secondary | ICD-10-CM | POA: Insufficient documentation

## 2021-08-14 DIAGNOSIS — F200 Paranoid schizophrenia: Secondary | ICD-10-CM | POA: Diagnosis not present

## 2021-08-14 DIAGNOSIS — M25531 Pain in right wrist: Secondary | ICD-10-CM | POA: Insufficient documentation

## 2021-08-14 DIAGNOSIS — E119 Type 2 diabetes mellitus without complications: Secondary | ICD-10-CM | POA: Insufficient documentation
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
## 2021-08-14 NOTE — ED Notes (Signed)
Discharge signature on paper placed in pt chart
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
## 2021-11-17 ENCOUNTER — Ambulatory Visit: Payer: Medicare Other | Admitting: Podiatry

## 2022-03-02 ENCOUNTER — Emergency Department
Admission: EM | Admit: 2022-03-02 | Discharge: 2022-03-02 | Disposition: A | Discharge status: Home / Self Care | Payer: Medicare Other | Attending: Emergency Medicine | Admitting: Emergency Medicine

## 2022-03-02 ENCOUNTER — Encounter: Payer: Self-pay | Admitting: Emergency Medicine

## 2022-03-02 ENCOUNTER — Other Ambulatory Visit: Payer: Self-pay

## 2022-03-02 DIAGNOSIS — T63441A Toxic effect of venom of bees, accidental (unintentional), initial encounter: Secondary | ICD-10-CM

## 2022-03-02 MED ORDER — DIPHENHYDRAMINE HCL 50 MG PO TABS: 50.0000 mg | ORAL_TABLET | Freq: Four times a day (QID) | ORAL | 0 refills | Status: AC | PRN

## 2022-03-02 NOTE — ED Provider Notes (Signed)
[facility] Provider Note  Patient Contact: 5:24 PM (approximate)   History   Insect Bite   HPI  Kathleen Gilmore is a 61 y.o. female who presents the emergency department complaining of pain and not to the right leg after being stung by a wasp.  Patient states that she has had no other symptoms to include shortness of breath, generalized hives, and this is.  No history of allergic reactions to bee stings.  She is never used an Academic libr[employer].     Physical Exam   Triage Vital Signs: ED Triage Vitals  Enc Vitals Group     BP 03/02/22 1721 109/71     Pulse Rate 03/02/22 1721 90     Resp 03/02/22 1721 18     Temp 03/02/22 1721 98.4 F (36.9 C)     Temp Source 03/02/22 1721 Oral     SpO2 03/02/22 1721 97 %     Weight 03/02/22 1720 200 lb (90.7 kg)     Height 03/02/22 1720 '5\' 4"'$  (1.626 m)     Head Circumference --      Peak Flow --      Pain Score 03/02/22 1720 9     Pain Loc --      Pain Edu? --      Excl. in [place]? --     Most recent vital signs: Vitals:   03/02/22 1721  BP: 109/71  Pulse: 90  Resp: 18  Temp: 98.4 F (36.9 C)  SpO2: 97%     General: Alert and in no acute distress. ENT:      Ears:       Nose: No congestion/rhinnorhea.      Mouth/Throat: No angioedema  Cardiovascular:  Good peripheral perfusion Respiratory: Normal respiratory effort without tachypnea or retractions. Lungs CTAB. Good air entry to the bases with no decreased or absent breath sounds.  Musculoskeletal: Full range of motion to all extremities. Small erythematous lesion noted to RLE consistent with bee sting.   Neurologic:  No gross focal neurologic deficits are appreciated.  Skin:   No rash noted Other:   ED Results / Procedures / Treatments   Labs (all labs ordered are listed, but only abnormal results are displayed) Labs Reviewed - No data to display   EKG     RADIOLOGY    No results found.  PROCEDURES:  Critical Care performed:  No  Procedures   MEDICATIONS ORDERED IN ED: Medications - No data to display   IMPRESSION / MDM / [place] / ED COURSE  I reviewed the triage vital signs and the nursing notes.                              Differential diagnosis includes, but is not limited to, bee sting, allergic reaction, insect bite, anaphylaxis  Patient's presentation is most consistent with acute presentation with potential threat to life or bodily function.   Patient's diagnosis is consistent with bee sting.  Patient presented after being stung by wasp in the right leg.  Localized erythematous lesion consistent with bee sting.  No evidence of anaphylaxis.  Patient will have Benadryl prescribed for her.  No indication for further work-up or labs.  Follow-up primary care as needed.  Return precautions discussed with patient. Patient is given ED precautions to return to the ED for any worsening or new symptoms.        FINAL CLINICAL IMPRESSION(S) /  ED DIAGNOSES   Final diagnoses:  Bee sting reaction, accidental or unintentional, initial encounter     Rx / DC Orders   ED Discharge Orders          Ordered    diphenhydrAMINE (BENADRYL) 50 MG tablet  Every 6 hours PRN        03/02/22 1725             Note:  This document was prepared using Dragon voice recognition software and may include unintentional dictation errors.   Brynda Peon 03/02/22 1729    Duffy Bruce, MD 03/02/22 [phone]

## 2022-03-02 NOTE — ED Triage Notes (Signed)
Patient to ED for bee sting. Patient got stung yesterday on the right leg. Redness and swelling noted.

## 2022-07-21 IMAGING — DX DG KNEE COMPLETE 4+V*R*
4 series · 4 of 4 positions shown · non-contrast
Comparison: Plain films right knee 10/23/2017.

CLINICAL DATA: Right knee pain.  No known injury.

EXAM:
RIGHT KNEE - COMPLETE 4+ VIEW

[knee ap]
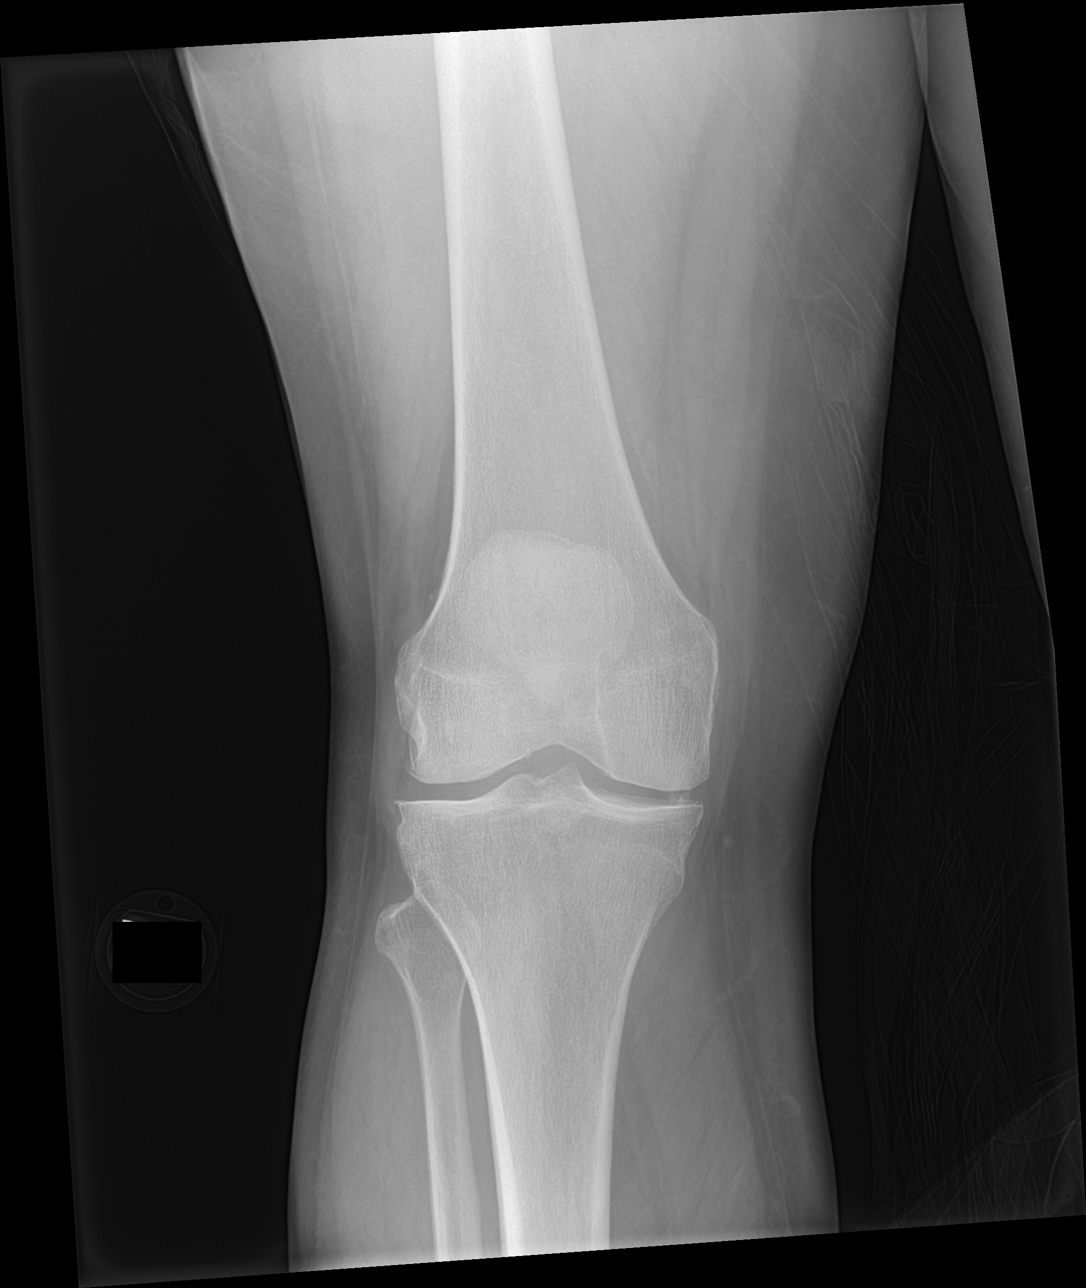

[knee lat]
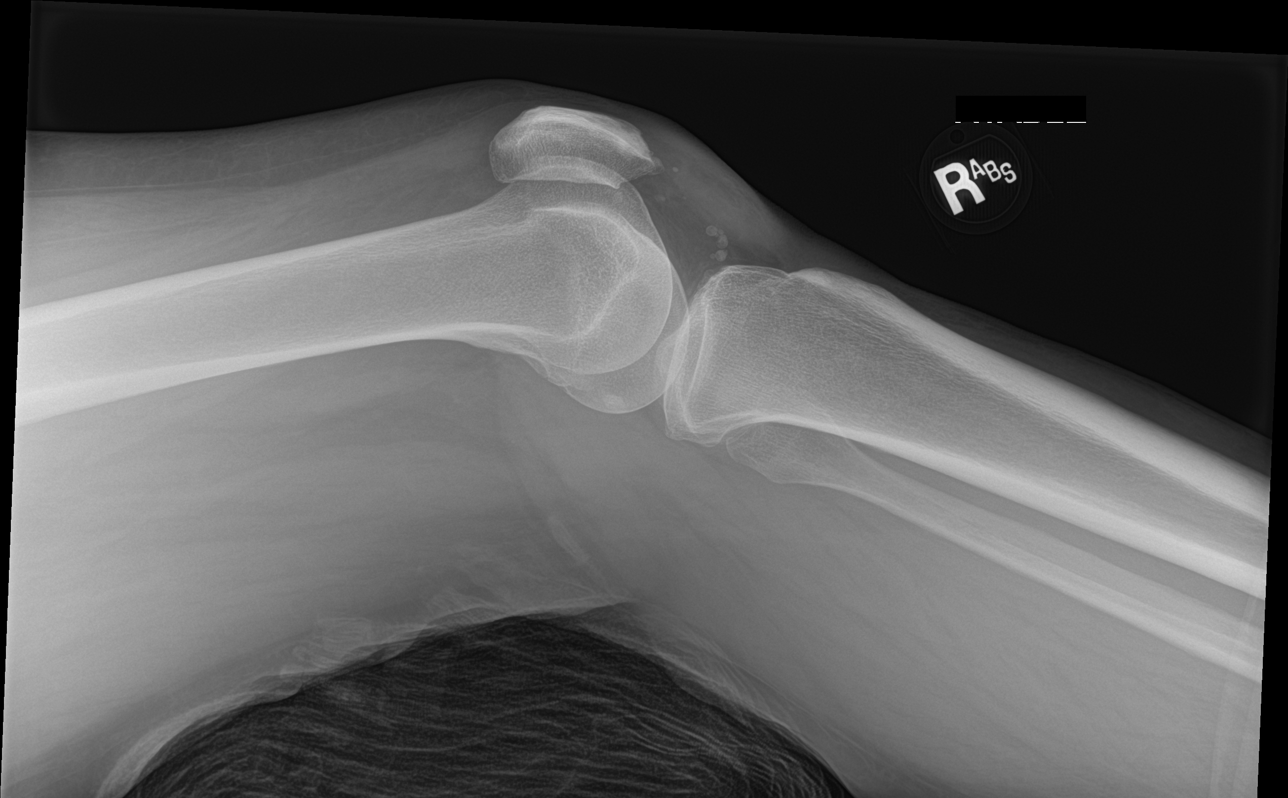

[knee obl (1 of 2)]
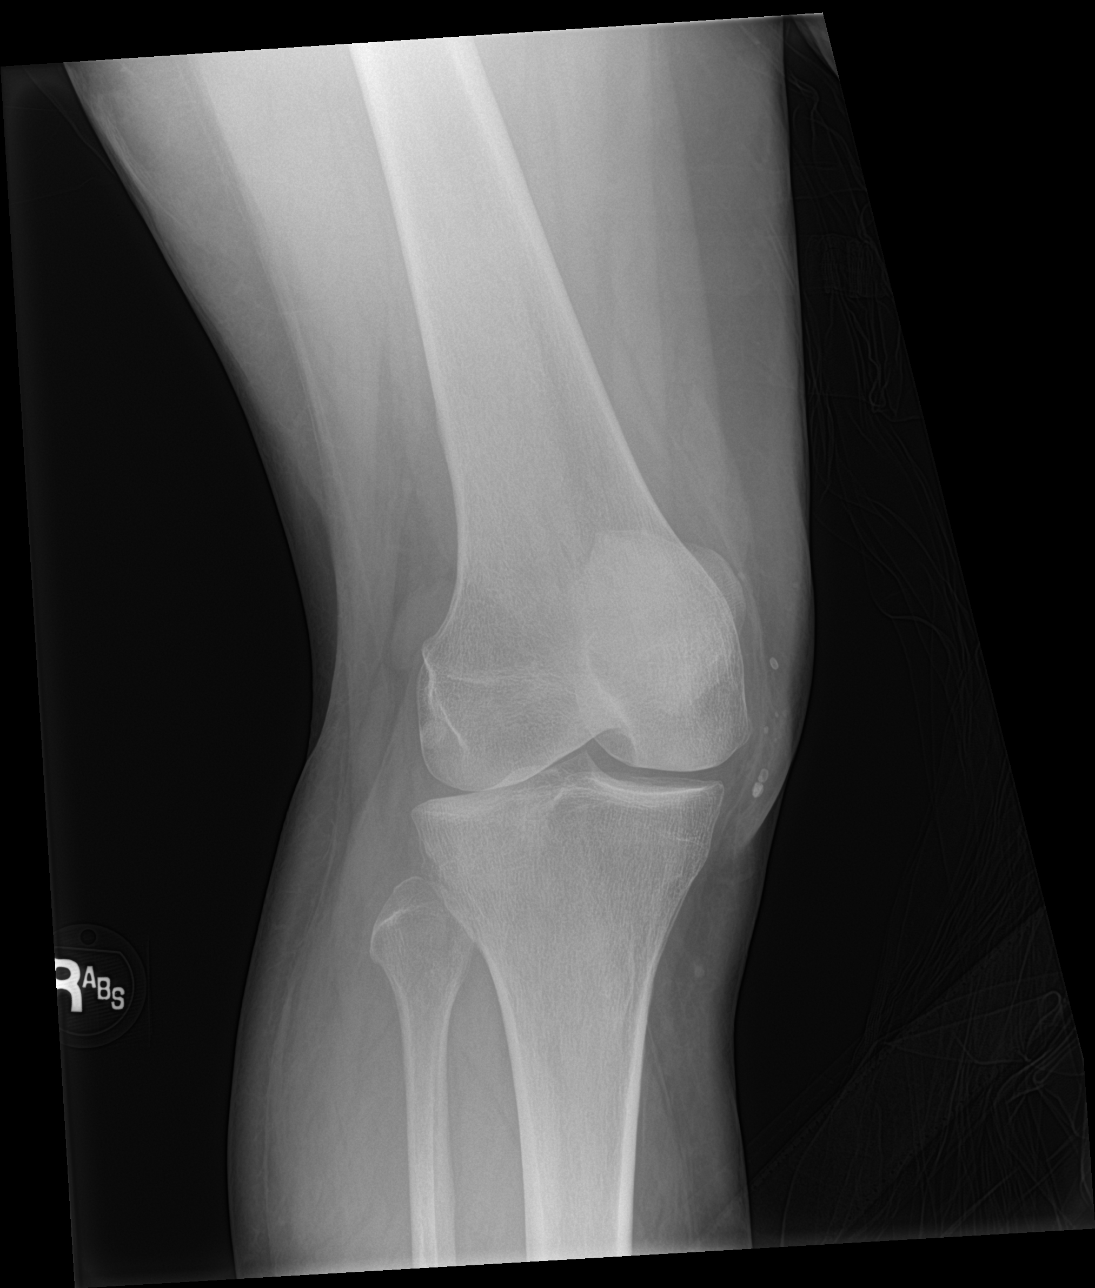

[knee obl (2 of 2)]
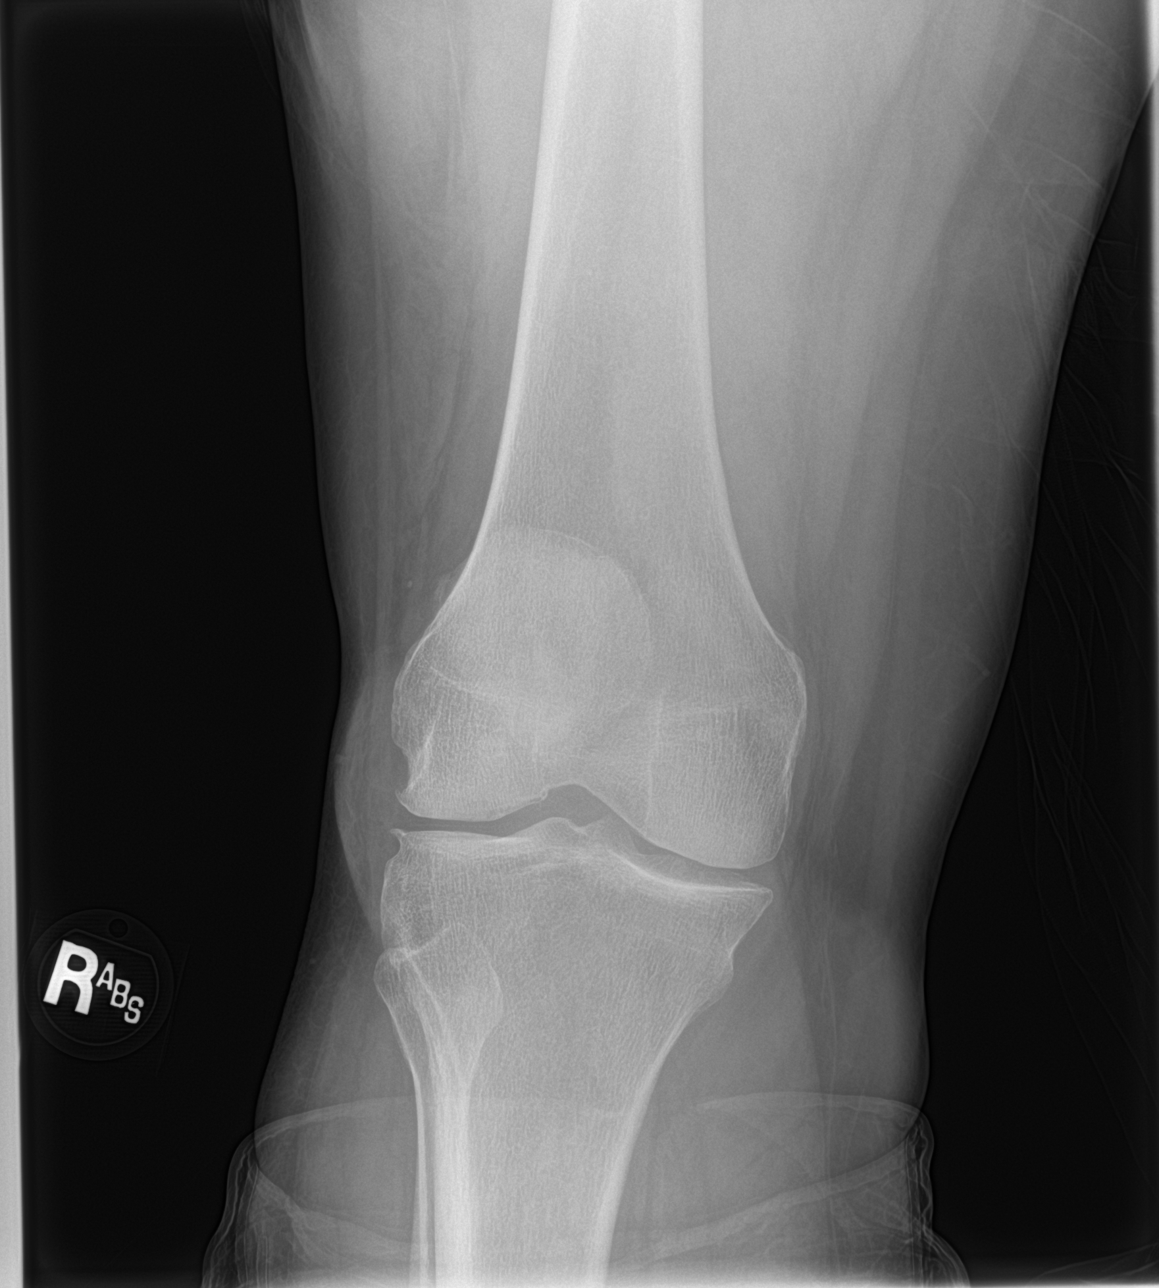

[4 of 4 positions shown; findings below may reference images not displayed]

FINDINGS: No acute bony or joint abnormality is identified. Joint spaces are
preserved. Cluster of small subcutaneous calcifications in the
anterior, medial soft tissues is unchanged and may be due to some
prior inflammatory process. Joint spaces are preserved.
IMPRESSION: Negative exam.

## 2022-08-11 ENCOUNTER — Encounter: Payer: Self-pay | Admitting: Family Medicine

## 2022-08-11 ENCOUNTER — Ambulatory Visit: Payer: Medicare Other | Admitting: Nurse Practitioner

## 2022-08-11 DIAGNOSIS — Z113 Encounter for screening for infections with a predominantly sexual mode of transmission: Secondary | ICD-10-CM

## 2022-08-11 LAB — WET PREP FOR TRICH, YEAST, CLUE
Trichomonas Exam: NEGATIVE
Yeast Exam: NEGATIVE
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
## 2022-08-12 NOTE — Progress Notes (Signed)
Wet prep reviewed, no treatment needed. Gregary Cromer, FNP

## 2022-08-18 ENCOUNTER — Other Ambulatory Visit: Payer: Medicare Other

## 2022-09-27 ENCOUNTER — Other Ambulatory Visit: Payer: Self-pay

## 2022-09-27 ENCOUNTER — Emergency Department: Payer: 59

## 2022-09-27 ENCOUNTER — Emergency Department
Admission: EM | Admit: 2022-09-27 | Discharge: 2022-09-27 | Disposition: A | Discharge status: Home / Self Care | Payer: 59 | Attending: Student in an Organized Health Care Education/Training Program | Admitting: Student in an Organized Health Care Education/Training Program

## 2022-09-27 DIAGNOSIS — R519 Headache, unspecified: Secondary | ICD-10-CM | POA: Diagnosis not present

## 2022-09-27 DIAGNOSIS — R0789 Other chest pain: Secondary | ICD-10-CM | POA: Insufficient documentation

## 2022-09-27 DIAGNOSIS — M542 Cervicalgia: Secondary | ICD-10-CM | POA: Diagnosis present

## 2022-09-27 DIAGNOSIS — K760 Fatty (change of) liver, not elsewhere classified: Secondary | ICD-10-CM | POA: Insufficient documentation

## 2022-09-27 DIAGNOSIS — I700 Atherosclerosis of aorta: Secondary | ICD-10-CM | POA: Diagnosis not present

## 2022-09-27 LAB — CBC WITH DIFFERENTIAL/PLATELET
Abs Immature Granulocytes: 0.02 10*3/uL (ref 0.00–0.07)
Basophils Absolute: 0.1 10*3/uL (ref 0.0–0.1)
Basophils Relative: 1 %
Eosinophils Absolute: 0 10*3/uL (ref 0.0–0.5)
Eosinophils Relative: 0 %
HCT: 44.7 % (ref 36.0–46.0)
Hemoglobin: 14.7 g/dL (ref 12.0–15.0)
Immature Granulocytes: 0 %
Lymphocytes Relative: 44 %
Lymphs Abs: 3.1 10*3/uL (ref 0.7–4.0)
MCH: 28.1 pg (ref 26.0–34.0)
MCHC: 32.9 g/dL (ref 30.0–36.0)
MCV: 85.3 fL (ref 80.0–100.0)
Monocytes Absolute: 0.4 10*3/uL (ref 0.1–1.0)
Monocytes Relative: 6 %
Neutro Abs: 3.5 10*3/uL (ref 1.7–7.7)
Neutrophils Relative %: 49 %
Platelets: 248 10*3/uL (ref 150–400)
RBC: 5.24 MIL/uL — ABNORMAL HIGH (ref 3.87–5.11)
RDW: 12.3 % (ref 11.5–15.5)
WBC: 7.1 10*3/uL (ref 4.0–10.5)
nRBC: 0 % (ref 0.0–0.2)

## 2022-09-27 LAB — COMPREHENSIVE METABOLIC PANEL
ALT: 24 U/L (ref 0–44)
AST: 22 U/L (ref 15–41)
Albumin: 3.8 g/dL (ref 3.5–5.0)
Alkaline Phosphatase: 75 U/L (ref 38–126)
Anion gap: 10 (ref 5–15)
BUN: 13 mg/dL (ref 8–23)
CO2: 21 mmol/L — ABNORMAL LOW (ref 22–32)
Calcium: 9.2 mg/dL (ref 8.9–10.3)
Chloride: 105 mmol/L (ref 98–111)
Creatinine, Ser: 0.83 mg/dL (ref 0.44–1.00)
GFR, Estimated: >60 mL/min (ref >60)
Glucose, Bld: 173 mg/dL — ABNORMAL HIGH (ref 70–99)
Potassium: 3.8 mmol/L (ref 3.5–5.1)
Sodium: 136 mmol/L (ref 135–145)
Total Bilirubin: 0.4 mg/dL (ref 0.3–1.2)
Total Protein: 7.4 g/dL (ref 6.5–8.1)

## 2022-09-27 LAB — TROPONIN I (HIGH SENSITIVITY)
Troponin I (High Sensitivity): <2 ng/L (ref <18)
Troponin I (High Sensitivity): 3 ng/L (ref <18)

## 2022-09-27 MED ORDER — OXYCODONE-ACETAMINOPHEN 5-325 MG PO TABS
1.0000 | ORAL_TABLET | Freq: Once | ORAL | Status: AC
  Administered 2022-09-27: 1 via ORAL
  Filled 2022-09-27: qty 1

## 2022-09-27 MED ORDER — TRAMADOL HCL 50 MG PO TABS: 50.0000 mg | ORAL_TABLET | Freq: Four times a day (QID) | ORAL | 0 refills | Status: AC | PRN

## 2022-09-27 MED ORDER — IOHEXOL 300 MG/ML  SOLN
100.0000 mL | Freq: Once | INTRAMUSCULAR | Status: AC | PRN
  Administered 2022-09-27: 100 mL via INTRAVENOUS

## 2022-09-27 NOTE — Discharge Instructions (Addendum)
Take the tramadol as needed for pain.  Return to the ER for new, worsening, or persistent severe neck, head, chest, or abdominal pain, weakness or numbness in the arms or hands, or any other new or worsening symptoms that concern you.  Follow-up with your primary care doctor.  You may wear the collar as needed for the next few days for comfort, however if you keep it on for too long the neck can get stiff.  Do the exercises in the provided instructions.
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
## 2022-09-27 NOTE — ED Provider Notes (Addendum)
-----------------------------------------   5:15 PM on 09/27/2022 -----------------------------------------  I took over care of this patient from Dr. Quentin Cornwall.  I independently viewed and interpreted the images for the CT head and cervical spine.  CT of the chest, abdomen, and pelvis is pending.  CT head: No ICH.  Radiology read pending  CT cervical spine: No acute fracture per radiology  CT chest/abdomen/pelvis: Read pending  The patient states that she needs to leave now and cannot wait for the reads on the scans because she needs to drive and cannot drive after dark.  I explained to that she is free to leave although if the scans show any critical findings this could delay her care if she cannot immediately get back or if I cannot immediately get in touch with her.  She understands this risk and states that she does not want to wait.  I verified that her cellphone number in the demographic information is correct and that she has the phone on her.  I gave the patient strict return precautions and she expresses understanding.  ----------------------------------------- 7:26 PM on 09/27/2022 -----------------------------------------  CT chest/abdomen/pelvis is negative for acute findings.   Arta Silence, MD 09/27/22 1719    Arta Silence, MD 09/27/22 [phone]

## 2022-09-27 NOTE — ED Notes (Signed)
Pt states 6/10 sharp pain in neck and radiating to chest. Pt states relief with the neck brace.
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
## 2022-11-30 ENCOUNTER — Emergency Department
Admission: EM | Admit: 2022-11-30 | Discharge: 2022-11-30 | Disposition: A | Discharge status: Home / Self Care | Payer: Medicare Other | Attending: Emergency Medicine | Admitting: Emergency Medicine

## 2022-11-30 ENCOUNTER — Other Ambulatory Visit: Payer: Self-pay

## 2022-11-30 DIAGNOSIS — Z8673 Personal history of transient ischemic attack (TIA), and cerebral infarction without residual deficits: Secondary | ICD-10-CM | POA: Diagnosis not present

## 2022-11-30 DIAGNOSIS — J449 Chronic obstructive pulmonary disease, unspecified: Secondary | ICD-10-CM | POA: Insufficient documentation

## 2022-11-30 DIAGNOSIS — B379 Candidiasis, unspecified: Secondary | ICD-10-CM | POA: Insufficient documentation

## 2022-11-30 DIAGNOSIS — R300 Dysuria: Secondary | ICD-10-CM | POA: Diagnosis present

## 2022-11-30 DIAGNOSIS — E119 Type 2 diabetes mellitus without complications: Secondary | ICD-10-CM | POA: Diagnosis not present

## 2022-11-30 LAB — BASIC METABOLIC PANEL
Anion gap: 9 (ref 5–15)
BUN: 11 mg/dL (ref 8–23)
CO2: 26 mmol/L (ref 22–32)
Calcium: 9.4 mg/dL (ref 8.9–10.3)
Chloride: 99 mmol/L (ref 98–111)
Creatinine, Ser: 0.89 mg/dL (ref 0.44–1.00)
GFR, Estimated: >60 mL/min (ref >60)
Glucose, Bld: 285 mg/dL — ABNORMAL HIGH (ref 70–99)
Potassium: 3.9 mmol/L (ref 3.5–5.1)
Sodium: 134 mmol/L — ABNORMAL LOW (ref 135–145)

## 2022-11-30 LAB — CBC WITH DIFFERENTIAL/PLATELET
Abs Immature Granulocytes: 0.03 10*3/uL (ref 0.00–0.07)
Basophils Absolute: 0.1 10*3/uL (ref 0.0–0.1)
Basophils Relative: 1 %
Eosinophils Absolute: 0 10*3/uL (ref 0.0–0.5)
Eosinophils Relative: 0 %
HCT: 43.3 % (ref 36.0–46.0)
Hemoglobin: 14.7 g/dL (ref 12.0–15.0)
Immature Granulocytes: 0 %
Lymphocytes Relative: 35 %
Lymphs Abs: 2.4 10*3/uL (ref 0.7–4.0)
MCH: 28.4 pg (ref 26.0–34.0)
MCHC: 33.9 g/dL (ref 30.0–36.0)
MCV: 83.8 fL (ref 80.0–100.0)
Monocytes Absolute: 0.4 10*3/uL (ref 0.1–1.0)
Monocytes Relative: 6 %
Neutro Abs: 3.9 10*3/uL (ref 1.7–7.7)
Neutrophils Relative %: 58 %
Platelets: 263 10*3/uL (ref 150–400)
RBC: 5.17 MIL/uL — ABNORMAL HIGH (ref 3.87–5.11)
RDW: 12.1 % (ref 11.5–15.5)
WBC: 6.8 10*3/uL (ref 4.0–10.5)
nRBC: 0 % (ref 0.0–0.2)

## 2022-11-30 LAB — URINALYSIS, ROUTINE W REFLEX MICROSCOPIC
Bilirubin Urine: NEGATIVE
Glucose, UA: >=500 mg/dL — AB
Hgb urine dipstick: NEGATIVE
Ketones, ur: NEGATIVE mg/dL
Leukocytes,Ua: NEGATIVE
Nitrite: POSITIVE — AB
Protein, ur: NEGATIVE mg/dL
Specific Gravity, Urine: 1.021 (ref 1.005–1.030)
pH: 5 (ref 5.0–8.0)

## 2022-11-30 MED ORDER — SODIUM CHLORIDE 0.9 % IV BOLUS: 1000.0000 mL | Freq: Once | INTRAVENOUS | Status: DC

## 2022-11-30 MED ORDER — CLOTRIMAZOLE-BETAMETHASONE 1-0.05 % EX CREA: 1.0000 | TOPICAL_CREAM | Freq: Two times a day (BID) | CUTANEOUS | 0 refills | Status: AC

## 2022-11-30 MED ORDER — CEFDINIR 300 MG PO CAPS: 300.0000 mg | ORAL_CAPSULE | Freq: Two times a day (BID) | ORAL | 0 refills | Status: DC

## 2022-11-30 MED ORDER — ACETAMINOPHEN 325 MG PO TABS
650.0000 mg | ORAL_TABLET | Freq: Once | ORAL | Status: AC
  Administered 2022-11-30: 650 mg via ORAL
  Filled 2022-11-30: qty 2

## 2022-11-30 NOTE — ED Notes (Signed)
Pt refused d/c vs due to the need to leave to get back home for her kids.

## 2022-11-30 NOTE — Discharge Instructions (Addendum)
Please use the cream as prescribed.  Please also take the antibiotics for your urinary tract infection.  It was highly recommended that you stay for blood work and treatment, and possible imaging, however you did not wish to do this.  Please follow-up with your outpatient provider.  Please return for any new, worsening, or change in symptoms or other concerns.

## 2022-11-30 NOTE — ED Notes (Signed)
RN collected pt blood. Pt refused IV and fluids. Pt did take tylenol PO as ordered. RN advised provider. Pt sts that she has been home by 1600 and wants to leave now. Pt sts that provider is suppose to give her a cream and than she needs to go.

## 2022-11-30 NOTE — ED Triage Notes (Signed)
Pt states burning with urination and around her rectum. Pt states she is not sexually active, but it feels like icy hot is on her peritoneal area that gets worse at night. Pt states it started a week ago
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
## 2022-12-01 ENCOUNTER — Telehealth: Payer: Self-pay | Admitting: Emergency Medicine

## 2022-12-01 MED ORDER — CEFDINIR 300 MG PO CAPS: 300.0000 mg | ORAL_CAPSULE | Freq: Two times a day (BID) | ORAL | 0 refills | Status: AC

## 2022-12-01 NOTE — Telephone Encounter (Signed)
Patient called the ER stating that she received her capsules from [facility] but no medication was within the capsules.  She returned to [facility] however [facility] is now calling us saying that they need a new prescription to refill the medication.  We are verifying with [facility] that there was in fact medication in the capsules and this could have been a patient error.  I am refilling the patient's Omnicef prescription and [facility] will verify that the medication is correct.

## 2024-01-10 ENCOUNTER — Other Ambulatory Visit

## 2024-01-12 ENCOUNTER — Other Ambulatory Visit

## 2024-01-17 ENCOUNTER — Emergency Department
Admission: EM | Admit: 2024-01-17 | Discharge: 2024-01-17 | Disposition: A | Discharge status: Home / Self Care | Attending: Emergency Medicine | Admitting: Emergency Medicine

## 2024-01-17 ENCOUNTER — Emergency Department

## 2024-01-17 ENCOUNTER — Other Ambulatory Visit: Payer: Self-pay

## 2024-01-17 DIAGNOSIS — R519 Headache, unspecified: Secondary | ICD-10-CM | POA: Diagnosis not present

## 2024-01-17 DIAGNOSIS — M79602 Pain in left arm: Secondary | ICD-10-CM

## 2024-01-17 DIAGNOSIS — Y92410 Unspecified street and highway as the place of occurrence of the external cause: Secondary | ICD-10-CM | POA: Insufficient documentation

## 2024-01-17 DIAGNOSIS — Z8673 Personal history of transient ischemic attack (TIA), and cerebral infarction without residual deficits: Secondary | ICD-10-CM | POA: Diagnosis not present

## 2024-01-17 DIAGNOSIS — M25562 Pain in left knee: Secondary | ICD-10-CM

## 2024-01-17 DIAGNOSIS — Z96652 Presence of left artificial knee joint: Secondary | ICD-10-CM | POA: Diagnosis not present

## 2024-01-17 DIAGNOSIS — S161XXA Strain of muscle, fascia and tendon at neck level, initial encounter: Secondary | ICD-10-CM | POA: Diagnosis not present

## 2024-01-17 DIAGNOSIS — E119 Type 2 diabetes mellitus without complications: Secondary | ICD-10-CM | POA: Insufficient documentation

## 2024-01-17 DIAGNOSIS — Z7982 Long term (current) use of aspirin: Secondary | ICD-10-CM | POA: Diagnosis not present

## 2024-01-17 DIAGNOSIS — M545 Low back pain, unspecified: Secondary | ICD-10-CM | POA: Insufficient documentation

## 2024-01-17 DIAGNOSIS — S199XXA Unspecified injury of neck, initial encounter: Secondary | ICD-10-CM | POA: Diagnosis present

## 2024-01-17 MED ORDER — CYCLOBENZAPRINE HCL 10 MG PO TABS: 10.0000 mg | ORAL_TABLET | Freq: Three times a day (TID) | ORAL | 0 refills | Status: AC | PRN

## 2024-01-17 MED ORDER — TRAMADOL HCL 50 MG PO TABS
50.0000 mg | ORAL_TABLET | Freq: Once | ORAL | Status: AC
  Administered 2024-01-17: 50 mg via ORAL
  Filled 2024-01-17: qty 1

## 2024-01-17 MED ORDER — MELOXICAM 15 MG PO TABS: 15.0000 mg | ORAL_TABLET | Freq: Every day | ORAL | 0 refills | Status: AC
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
## 2024-01-20 ENCOUNTER — Other Ambulatory Visit

## 2024-01-23 ENCOUNTER — Other Ambulatory Visit

## 2024-06-15 ENCOUNTER — Emergency Department

## 2024-06-15 ENCOUNTER — Other Ambulatory Visit: Payer: Self-pay

## 2024-06-15 ENCOUNTER — Emergency Department
Admission: EM | Admit: 2024-06-15 | Discharge: 2024-06-15 | Disposition: A | Discharge status: Home / Self Care | Attending: Emergency Medicine | Admitting: Emergency Medicine

## 2024-06-15 DIAGNOSIS — J449 Chronic obstructive pulmonary disease, unspecified: Secondary | ICD-10-CM | POA: Diagnosis not present

## 2024-06-15 DIAGNOSIS — S199XXA Unspecified injury of neck, initial encounter: Secondary | ICD-10-CM | POA: Diagnosis present

## 2024-06-15 DIAGNOSIS — G8929 Other chronic pain: Secondary | ICD-10-CM | POA: Insufficient documentation

## 2024-06-15 DIAGNOSIS — Y92410 Unspecified street and highway as the place of occurrence of the external cause: Secondary | ICD-10-CM | POA: Insufficient documentation

## 2024-06-15 DIAGNOSIS — M545 Low back pain, unspecified: Secondary | ICD-10-CM | POA: Insufficient documentation

## 2024-06-15 DIAGNOSIS — R519 Headache, unspecified: Secondary | ICD-10-CM | POA: Insufficient documentation

## 2024-06-15 DIAGNOSIS — S161XXA Strain of muscle, fascia and tendon at neck level, initial encounter: Secondary | ICD-10-CM | POA: Diagnosis not present

## 2024-06-15 DIAGNOSIS — M25562 Pain in left knee: Secondary | ICD-10-CM | POA: Insufficient documentation

## 2024-06-15 DIAGNOSIS — M25532 Pain in left wrist: Secondary | ICD-10-CM | POA: Insufficient documentation

## 2024-06-15 DIAGNOSIS — E119 Type 2 diabetes mellitus without complications: Secondary | ICD-10-CM | POA: Insufficient documentation

## 2024-06-15 HISTORY — DX: Chronic obstructive pulmonary disease, unspecified: J44.9

## 2024-06-15 MED ORDER — NAPROXEN 500 MG PO TABS
500.0000 mg | ORAL_TABLET | Freq: Once | ORAL | Status: AC
  Administered 2024-06-15: 500 mg via ORAL
  Filled 2024-06-15: qty 1
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
## 2024-06-15 NOTE — Discharge Instructions (Signed)
 Continue your Tramadol  as prescribed in addition to your other prescribed medications.
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
